# Patient Record
Sex: Male | Born: 1975
Health system: Southern US, Community
[De-identification: ages and names within clinical notes are randomized; demographics above are authoritative.]

## PROBLEM LIST (undated history)

## (undated) DIAGNOSIS — M1990 Unspecified osteoarthritis, unspecified site: Secondary | ICD-10-CM

## (undated) HISTORY — PX: WISDOM TOOTH EXTRACTION: SHX21

---

## 2016-11-19 DIAGNOSIS — Z0000 Encounter for general adult medical examination without abnormal findings: Secondary | ICD-10-CM | POA: Diagnosis not present

## 2016-11-23 DIAGNOSIS — E559 Vitamin D deficiency, unspecified: Secondary | ICD-10-CM | POA: Diagnosis not present

## 2016-11-23 DIAGNOSIS — E782 Mixed hyperlipidemia: Secondary | ICD-10-CM | POA: Diagnosis not present

## 2016-11-23 DIAGNOSIS — G4452 New daily persistent headache (NDPH): Secondary | ICD-10-CM | POA: Diagnosis not present

## 2016-11-23 DIAGNOSIS — G5603 Carpal tunnel syndrome, bilateral upper limbs: Secondary | ICD-10-CM | POA: Diagnosis not present

## 2016-11-23 MED FILL — ATORVASTATIN 40 MG TABLET: 40 | 30 days supply | Qty: 30 | Fill #0

## 2016-12-29 DIAGNOSIS — M7021 Olecranon bursitis, right elbow: Secondary | ICD-10-CM | POA: Diagnosis not present

## 2016-12-29 MED FILL — predniSONE 20 MG TABS: 20 | 7 days supply | Qty: 14 | Fill #0

## 2016-12-29 MED FILL — CEPHALEXIN 500 MG CAPSULE: 500 | 10 days supply | Qty: 30 | Fill #0

## 2017-02-01 MED FILL — valACYclovir HCL 1 GM TABS: 1 | 15 days supply | Qty: 30 | Fill #0

## 2017-03-24 DIAGNOSIS — E785 Hyperlipidemia, unspecified: Secondary | ICD-10-CM | POA: Diagnosis not present

## 2017-03-28 DIAGNOSIS — E782 Mixed hyperlipidemia: Secondary | ICD-10-CM | POA: Diagnosis not present

## 2017-03-28 DIAGNOSIS — R51 Headache: Secondary | ICD-10-CM | POA: Diagnosis not present

## 2017-03-28 DIAGNOSIS — Z23 Encounter for immunization: Secondary | ICD-10-CM | POA: Diagnosis not present

## 2017-05-05 DIAGNOSIS — G5603 Carpal tunnel syndrome, bilateral upper limbs: Secondary | ICD-10-CM | POA: Diagnosis not present

## 2017-05-05 MED FILL — DICLOFENAC SOD 75 MG TAB EC: 75 | 30 days supply | Qty: 60 | Fill #0

## 2017-05-23 ENCOUNTER — Encounter (INDEPENDENT_AMBULATORY_CARE_PROVIDER_SITE_OTHER): Payer: Self-pay | Admitting: Orthopaedic Surgery

## 2017-05-23 ENCOUNTER — Ambulatory Visit (INDEPENDENT_AMBULATORY_CARE_PROVIDER_SITE_OTHER): Payer: 59

## 2017-05-23 ENCOUNTER — Ambulatory Visit (INDEPENDENT_AMBULATORY_CARE_PROVIDER_SITE_OTHER): Payer: 59 | Admitting: Orthopaedic Surgery

## 2017-05-23 DIAGNOSIS — G5602 Carpal tunnel syndrome, left upper limb: Secondary | ICD-10-CM

## 2017-05-23 DIAGNOSIS — G5601 Carpal tunnel syndrome, right upper limb: Secondary | ICD-10-CM

## 2017-05-23 NOTE — Progress Notes (Signed)
   Office Visit Note   Patient: John Vaughn           Date of Birth: 02/27/1976           MRN: [id number] Visit Date: 05/23/2017              Requested by: No referring provider defined for this encounter. PCP: Lyndel SafeGentry, Daniel E., MD   Assessment & Plan: Visit Diagnoses:  1. Carpal tunnel syndrome, right upper limb   2. Carpal tunnel syndrome, left upper limb     Plan: Impression is bilateral carpal tunnel syndrome.  EMG is ordered with Dr. Alvester MorinNewton.  Follow-up after the EMGs.  Follow-Up Instructions: Return if symptoms worsen or fail to improve.   Orders:  Orders Placed This Encounter  Procedures  . XR Wrist Complete Right  . XR Wrist Complete Left   No orders of the defined types were placed in this encounter.     Procedures: No procedures performed   Clinical Data: No additional findings.   Subjective: Chief Complaint  Patient presents with  . Right Hand - Pain, Numbness  . Left Hand - Pain, Numbness    Patient is a 41 year old gentleman with hand pain and numbness at night with carpal tunnel splints which do help.  He has had 2 cortisone injections in his buttock which has helped.  That is worse with exertion and use of the hand.  He sleeps with carpal tunnel braces which do help..  Denies any injuries.    Review of Systems  Constitutional: Negative.   All other systems reviewed and are negative.    Objective: Vital Signs: There were no vitals taken for this visit.  Physical Exam  Constitutional: He is oriented to person, place, and time. He appears well-developed and well-nourished.  HENT:  Head: Normocephalic and atraumatic.  Eyes: Pupils are equal, round, and reactive to light.  Neck: Neck supple.  Pulmonary/Chest: Effort normal.  Abdominal: Soft.  Musculoskeletal: Normal range of motion.  Neurological: He is alert and oriented to person, place, and time.  Skin: Skin is warm.  Psychiatric: He has a normal mood and affect. His behavior is  normal. Judgment and thought content normal.  Nursing note and vitals reviewed.   Ortho Exam Bilateral hand exam shows no skin changes or lesions or rashes.  Negative carpal tunnel compression signs. Specialty Comments:  No specialty comments available.  Imaging: Xr Wrist Complete Left  Result Date: 05/23/2017 No acute findings.  Xr Wrist Complete Right  Result Date: 05/23/2017 No acute findings    PMFS History: There are no active problems to display for this patient.  History reviewed. No pertinent past medical history.  History reviewed. No pertinent family history.  History reviewed. No pertinent surgical history. Social History   Occupational History  . Not on file  Tobacco Use  . Smoking status: Not on file  Substance and Sexual Activity  . Alcohol use: Not on file  . Drug use: Not on file  . Sexual activity: Not on file

## 2017-05-24 ENCOUNTER — Other Ambulatory Visit (INDEPENDENT_AMBULATORY_CARE_PROVIDER_SITE_OTHER): Payer: Self-pay

## 2017-05-24 DIAGNOSIS — M79642 Pain in left hand: Principal | ICD-10-CM

## 2017-05-24 DIAGNOSIS — M79641 Pain in right hand: Secondary | ICD-10-CM

## 2017-06-10 ENCOUNTER — Ambulatory Visit (INDEPENDENT_AMBULATORY_CARE_PROVIDER_SITE_OTHER): Payer: 59 | Admitting: Physical Medicine and Rehabilitation

## 2017-06-10 ENCOUNTER — Encounter (INDEPENDENT_AMBULATORY_CARE_PROVIDER_SITE_OTHER): Payer: Self-pay | Admitting: Physical Medicine and Rehabilitation

## 2017-06-10 DIAGNOSIS — R202 Paresthesia of skin: Secondary | ICD-10-CM | POA: Diagnosis not present

## 2017-06-10 NOTE — Progress Notes (Deleted)
Bilateral hand and wrist pain. States right side is worse. Pain and numbness.  1 month ago increased constant pain, has had cortisone inj which has helped. Numb from thumb to 4th finger on right side. Mixing motion, using weed-eater cause "twinge" and loses grib. Dropping items more. Has to drive with one had at a time, hands will go numb. Wears braces at night, have been helpful. Pain and numbness is getting worse at night with braces.

## 2017-06-15 ENCOUNTER — Encounter (INDEPENDENT_AMBULATORY_CARE_PROVIDER_SITE_OTHER): Payer: Self-pay | Admitting: Physical Medicine and Rehabilitation

## 2017-06-15 NOTE — Progress Notes (Signed)
John Vaughn - 41 y.o. male MRN [id number]  Date of birth: 02/25/1976  Office Visit Note: Visit Date: 06/10/2017 PCP: Lyndel SafeGentry, Daniel E., MD Referred by: Lyndel SafeGentry, Daniel E., MD  Subjective: Chief Complaint  Patient presents with  . Right Hand - Pain, Numbness, Weakness  . Left Hand - Pain, Weakness, Numbness   HPI: John Vaughn is a 41 year old right-hand-dominant who has been having pain numbness and tingling right more than left hand fairly classic median nerve distribution.  He states that he is really numb from his thumb to his fourth digit on the right he initially saw his family practitioner in [place] cortisone injection intramuscularly which seemed to help some.  He reports worsening symptoms with using the weed eater and other jobs at night.  Feels like he has been dropping items a lot.  Reports worsening symptoms with driving.  Braces at night have been helpful.  Dr. Roda ShuttersXu saw him recently and ordered electrodiagnostic studies.  He has had no prior electrodiagnostic studies.  No frank radicular symptoms.  No specific trauma.    ROS Otherwise per HPI.  Assessment & Plan: Visit Diagnoses:  1. Paresthesia of skin     Plan: No additional findings.  Impression: The above electrodiagnostic study is ABNORMAL and reveals evidence of:  1.  A moderate right median nerve entrapment at the wrist (carpal tunnel syndrome) affecting sensory and motor components.   2.  A mild left median nerve entrapment at the wrist (carpal tunnel syndrome) affecting sensory components.   There is no significant electrodiagnostic evidence of any other focal nerve entrapment, brachial plexopathy or cervical radiculopathy.     Recommendations: 1.  Follow-up with referring physician. 2.  Continue current management of symptoms. 3.  Continue use of resting splint at night-time and as needed during the day. May consider surgical decompression on the right.   Meds & Orders: No orders of the defined  types were placed in this encounter.   Orders Placed This Encounter  Procedures  . NCV with EMG (electromyography)    Follow-up: Return for Dr. Roda ShuttersXu.   Procedures: No procedures performed  EMG & NCV Findings: Evaluation of the right median motor nerve showed prolonged distal onset latency (4.5 ms).  The left median (across palm) sensory and the right median (across palm) sensory nerves showed prolonged distal peak latency (Wrist, L3.9, R4.7 ms).  All remaining nerves (as indicated in the following tables) were within normal limits.  Left vs. Right side comparison data for the median motor nerve indicates abnormal L-R latency difference (0.8 ms).  The ulnar motor nerve indicates abnormal L-R amplitude difference (36.8 %).  All remaining left vs. right side differences were within normal limits.    All examined muscles (as indicated in the following table) showed no evidence of electrical instability.    Impression: The above electrodiagnostic study is ABNORMAL and reveals evidence of:  1.  A moderate right median nerve entrapment at the wrist (carpal tunnel syndrome) affecting sensory and motor components.   2.  A mild left median nerve entrapment at the wrist (carpal tunnel syndrome) affecting sensory components.   There is no significant electrodiagnostic evidence of any other focal nerve entrapment, brachial plexopathy or cervical radiculopathy.     Recommendations: 1.  Follow-up with referring physician. 2.  Continue current management of symptoms. 3.  Continue use of resting splint at night-time and as needed during the day. May consider surgical decompression on the right.    Nerve Conduction Studies  Anti Sensory Summary Table   Stim Site NR Peak (ms) Norm Peak (ms) P-T Amp (V) Norm P-T Amp Site1 Site2 Delta-P (ms) Dist (cm) Vel (m/s) Norm Vel (m/s)  Left Median Acr Palm Anti Sensory (2nd Digit)  31.4C  Wrist    *3.9 <3.6 18.1 >10 Wrist Palm 2.1 0.0    Palm    1.8 <2.0 22.3          Right Median Acr Palm Anti Sensory (2nd Digit)  32.2C  Wrist    *4.7 <3.6 19.0 >10 Wrist Palm 2.7 0.0    Palm    2.0 <2.0 22.6         Left Radial Anti Sensory (Base 1st Digit)  31.8C  Wrist    2.4 <3.1 11.1  Wrist Base 1st Digit 2.4 0.0    Right Radial Anti Sensory (Base 1st Digit)  31.4C  Wrist    2.3 <3.1 15.9  Wrist Base 1st Digit 2.3 0.0    Left Ulnar Anti Sensory (5th Digit)  32.1C  Wrist    3.0 <3.7 21.9 >15.0 Wrist 5th Digit 3.0 14.0 47 >38  Right Ulnar Anti Sensory (5th Digit)  31.7C  Wrist    3.1 <3.7 24.2 >15.0 Wrist 5th Digit 3.1 14.0 45 >38   Motor Summary Table   Stim Site NR Onset (ms) Norm Onset (ms) O-P Amp (mV) Norm O-P Amp Site1 Site2 Delta-0 (ms) Dist (cm) Vel (m/s) Norm Vel (m/s)  Left Median Motor (Abd Poll Brev)  32C  Wrist    3.7 <4.2 7.5 >5 Elbow Wrist 4.0 23.2 58 >50  Elbow    7.7  6.6         Right Median Motor (Abd Poll Brev)  31C  Wrist    *4.5 <4.2 6.4 >5 Elbow Wrist 4.6 23.5 51 >50  Elbow    9.1  4.5         Left Ulnar Motor (Abd Dig Min)  32.2C  Wrist    2.7 <4.2 12.5 >3 B Elbow Wrist 3.7 22.0 59 >53  B Elbow    6.4  12.1  A Elbow B Elbow 1.3 10.0 77 >53  A Elbow    7.7  12.6         Right Ulnar Motor (Abd Dig Min)  30.7C  Wrist    3.0 <4.2 7.9 >3 B Elbow Wrist 3.4 22.5 66 >53  B Elbow    6.4  12.7  A Elbow B Elbow 1.3 10.5 81 >53  A Elbow    7.7  13.1          EMG   Side Muscle Nerve Root Ins Act Fibs Psw Amp Dur Poly Recrt Int Dennie Bible Comment  Right Abd Poll Brev Median C8-T1 Nml Nml Nml Nml Nml 0 Nml Nml   Right 1stDorInt Ulnar C8-T1 Nml Nml Nml Nml Nml 0 Nml Nml   Right PronatorTeres Median C6-7 Nml Nml Nml Nml Nml 0 Nml Nml   Right Biceps Musculocut C5-6 Nml Nml Nml Nml Nml 0 Nml Nml   Right Deltoid Axillary C5-6 Nml Nml Nml Nml Nml 0 Nml Nml     Nerve Conduction Studies Anti Sensory Left/Right Comparison   Stim Site L Lat (ms) R Lat (ms) L-R Lat (ms) L Amp (V) R Amp (V) L-R Amp (%) Site1 Site2 L Vel (m/s) R Vel (m/s) L-R  Vel (m/s)  Median Acr Palm Anti Sensory (2nd Digit)  31.4C  Wrist *3.9 *4.7 0.8 18.1 19.0 4.7  Wrist Palm     Palm 1.8 2.0 0.2 22.3 22.6 1.3       Radial Anti Sensory (Base 1st Digit)  31.8C  Wrist 2.4 2.3 0.1 11.1 15.9 30.2 Wrist Base 1st Digit     Ulnar Anti Sensory (5th Digit)  32.1C  Wrist 3.0 3.1 0.1 21.9 24.2 9.5 Wrist 5th Digit 47 45 2   Motor Left/Right Comparison   Stim Site L Lat (ms) R Lat (ms) L-R Lat (ms) L Amp (mV) R Amp (mV) L-R Amp (%) Site1 Site2 L Vel (m/s) R Vel (m/s) L-R Vel (m/s)  Median Motor (Abd Poll Brev)  32C  Wrist 3.7 *4.5 *0.8 7.5 6.4 14.7 Elbow Wrist 58 51 7  Elbow 7.7 9.1 1.4 6.6 4.5 31.8       Ulnar Motor (Abd Dig Min)  32.2C  Wrist 2.7 3.0 0.3 12.5 7.9 *36.8 B Elbow Wrist 59 66 7  B Elbow 6.4 6.4 0.0 12.1 12.7 4.7 A Elbow B Elbow 77 81 4  A Elbow 7.7 7.7 0.0 12.6 13.1 3.8          Waveforms:                     Clinical History: No specialty comments available.  He has no tobacco history on file. No results for input(s): HGBA1C, LABURIC in the last 8760 hours.  Objective:  VS:  HT:    WT:   BMI:     BP:   HR: bpm  TEMP: ( )  RESP:  Physical Exam  Musculoskeletal:  Inspection reveals no atrophy of the bilateral APB or FDI or hand intrinsics. There is no swelling, color changes, allodynia or dystrophic changes. There is 5 out of 5 strength in the bilateral wrist extension, finger abduction and long finger flexion. There is intact sensation to light touch in all dermatomal and peripheral nerve distributions. There is a positive Phalen's test on the right. There is a negative Hoffmann's test bilaterally.    Ortho Exam Imaging: No results found.  Past Medical/Family/Surgical/Social History: Medications & Allergies reviewed per EMR There are no active problems to display for this patient.  History reviewed. No pertinent past medical history. History reviewed. No pertinent family history. History reviewed. No pertinent surgical  history. Social History   Occupational History  . Not on file  Tobacco Use  . Smoking status: Not on file  Substance and Sexual Activity  . Alcohol use: Not on file  . Drug use: Not on file  . Sexual activity: Not on file

## 2017-06-15 NOTE — Procedures (Signed)
EMG & NCV Findings: Evaluation of the right median motor nerve showed prolonged distal onset latency (4.5 ms).  The left median (across palm) sensory and the right median (across palm) sensory nerves showed prolonged distal peak latency (Wrist, L3.9, R4.7 ms).  All remaining nerves (as indicated in the following tables) were within normal limits.  Left vs. Right side comparison data for the median motor nerve indicates abnormal L-R latency difference (0.8 ms).  The ulnar motor nerve indicates abnormal L-R amplitude difference (36.8 %).  All remaining left vs. right side differences were within normal limits.    All examined muscles (as indicated in the following table) showed no evidence of electrical instability.    Impression: The above electrodiagnostic study is ABNORMAL and reveals evidence of:  1.  A moderate right median nerve entrapment at the wrist (carpal tunnel syndrome) affecting sensory and motor components.   2.  A mild left median nerve entrapment at the wrist (carpal tunnel syndrome) affecting sensory components.   There is no significant electrodiagnostic evidence of any other focal nerve entrapment, brachial plexopathy or cervical radiculopathy.     Recommendations: 1.  Follow-up with referring physician. 2.  Continue current management of symptoms. 3.  Continue use of resting splint at night-time and as needed during the day. May consider surgical decompression on the right.    Nerve Conduction Studies Anti Sensory Summary Table   Stim Site NR Peak (ms) Norm Peak (ms) P-T Amp (V) Norm P-T Amp Site1 Site2 Delta-P (ms) Dist (cm) Vel (m/s) Norm Vel (m/s)  Left Median Acr Palm Anti Sensory (2nd Digit)  31.4C  Wrist    *3.9 <3.6 18.1 >10 Wrist Palm 2.1 0.0    Palm    1.8 <2.0 22.3         Right Median Acr Palm Anti Sensory (2nd Digit)  32.2C  Wrist    *4.7 <3.6 19.0 >10 Wrist Palm 2.7 0.0    Palm    2.0 <2.0 22.6         Left Radial Anti Sensory (Base 1st Digit)   31.8C  Wrist    2.4 <3.1 11.1  Wrist Base 1st Digit 2.4 0.0    Right Radial Anti Sensory (Base 1st Digit)  31.4C  Wrist    2.3 <3.1 15.9  Wrist Base 1st Digit 2.3 0.0    Left Ulnar Anti Sensory (5th Digit)  32.1C  Wrist    3.0 <3.7 21.9 >15.0 Wrist 5th Digit 3.0 14.0 47 >38  Right Ulnar Anti Sensory (5th Digit)  31.7C  Wrist    3.1 <3.7 24.2 >15.0 Wrist 5th Digit 3.1 14.0 45 >38   Motor Summary Table   Stim Site NR Onset (ms) Norm Onset (ms) O-P Amp (mV) Norm O-P Amp Site1 Site2 Delta-0 (ms) Dist (cm) Vel (m/s) Norm Vel (m/s)  Left Median Motor (Abd Poll Brev)  32C  Wrist    3.7 <4.2 7.5 >5 Elbow Wrist 4.0 23.2 58 >50  Elbow    7.7  6.6         Right Median Motor (Abd Poll Brev)  31C  Wrist    *4.5 <4.2 6.4 >5 Elbow Wrist 4.6 23.5 51 >50  Elbow    9.1  4.5         Left Ulnar Motor (Abd Dig Min)  32.2C  Wrist    2.7 <4.2 12.5 >3 B Elbow Wrist 3.7 22.0 59 >53  B Elbow    6.4  12.1  A Elbow  B Elbow 1.3 10.0 77 >53  A Elbow    7.7  12.6         Right Ulnar Motor (Abd Dig Min)  30.7C  Wrist    3.0 <4.2 7.9 >3 B Elbow Wrist 3.4 22.5 66 >53  B Elbow    6.4  12.7  A Elbow B Elbow 1.3 10.5 81 >53  A Elbow    7.7  13.1          EMG   Side Muscle Nerve Root Ins Act Fibs Psw Amp Dur Poly Recrt Int Dennie BiblePat Comment  Right Abd Poll Brev Median C8-T1 Nml Nml Nml Nml Nml 0 Nml Nml   Right 1stDorInt Ulnar C8-T1 Nml Nml Nml Nml Nml 0 Nml Nml   Right PronatorTeres Median C6-7 Nml Nml Nml Nml Nml 0 Nml Nml   Right Biceps Musculocut C5-6 Nml Nml Nml Nml Nml 0 Nml Nml   Right Deltoid Axillary C5-6 Nml Nml Nml Nml Nml 0 Nml Nml     Nerve Conduction Studies Anti Sensory Left/Right Comparison   Stim Site L Lat (ms) R Lat (ms) L-R Lat (ms) L Amp (V) R Amp (V) L-R Amp (%) Site1 Site2 L Vel (m/s) R Vel (m/s) L-R Vel (m/s)  Median Acr Palm Anti Sensory (2nd Digit)  31.4C  Wrist *3.9 *4.7 0.8 18.1 19.0 4.7 Wrist Palm     Palm 1.8 2.0 0.2 22.3 22.6 1.3       Radial Anti Sensory (Base 1st Digit)   31.8C  Wrist 2.4 2.3 0.1 11.1 15.9 30.2 Wrist Base 1st Digit     Ulnar Anti Sensory (5th Digit)  32.1C  Wrist 3.0 3.1 0.1 21.9 24.2 9.5 Wrist 5th Digit 47 45 2   Motor Left/Right Comparison   Stim Site L Lat (ms) R Lat (ms) L-R Lat (ms) L Amp (mV) R Amp (mV) L-R Amp (%) Site1 Site2 L Vel (m/s) R Vel (m/s) L-R Vel (m/s)  Median Motor (Abd Poll Brev)  32C  Wrist 3.7 *4.5 *0.8 7.5 6.4 14.7 Elbow Wrist 58 51 7  Elbow 7.7 9.1 1.4 6.6 4.5 31.8       Ulnar Motor (Abd Dig Min)  32.2C  Wrist 2.7 3.0 0.3 12.5 7.9 *36.8 B Elbow Wrist 59 66 7  B Elbow 6.4 6.4 0.0 12.1 12.7 4.7 A Elbow B Elbow 77 81 4  A Elbow 7.7 7.7 0.0 12.6 13.1 3.8          Waveforms:

## 2017-06-16 ENCOUNTER — Ambulatory Visit (INDEPENDENT_AMBULATORY_CARE_PROVIDER_SITE_OTHER): Payer: 59 | Admitting: Orthopaedic Surgery

## 2017-06-16 DIAGNOSIS — G5601 Carpal tunnel syndrome, right upper limb: Secondary | ICD-10-CM | POA: Diagnosis not present

## 2017-06-16 MED ORDER — LIDOCAINE HCL 1 % IJ SOLN
1.0000 mL | INTRAMUSCULAR | Status: AC | PRN
  Administered 2017-06-16: 1 mL

## 2017-06-16 MED ORDER — METHYLPREDNISOLONE ACETATE 40 MG/ML IJ SUSP
40.0000 mg | INTRAMUSCULAR | Status: AC | PRN
Start: 2017-06-16 — End: 2017-06-16
  Administered 2017-06-16: 40 mg

## 2017-06-16 MED ORDER — BUPIVACAINE HCL 0.5 % IJ SOLN
1.0000 mL | INTRAMUSCULAR | Status: AC | PRN
  Administered 2017-06-16: 1 mL

## 2017-06-16 NOTE — Progress Notes (Signed)
   Office Visit Note   Patient: John Vaughn           Date of Birth: 09/14/1975           MRN: [id number] Visit Date: 06/16/2017              Requested by: Lyndel SafeGentry, Daniel E., MD [address] [place], [place] [id number] PCP: Lyndel SafeGentry, Daniel E., MD   Assessment & Plan: Visit Diagnoses:  1. Carpal tunnel syndrome, right upper limb     Plan: Impression is moderate right carpal tunnel syndrome and mild left carpal tunnel syndrome.  We had a long discussion regarding the treatment options including injection versus splinting versus surgical release.  We talked about the details of surgery and the associated risk benefits alternatives.  Patient would like to try to injection in his right wrist first.  This was performed today.  Follow-up with me as needed. Total face to face encounter time was greater than 25 minutes and over half of this time was spent in counseling and/or coordination of care.  Follow-Up Instructions: Return if symptoms worsen or fail to improve.   Orders:  No orders of the defined types were placed in this encounter.  No orders of the defined types were placed in this encounter.     Procedures: Hand/UE Inj: R carpal tunnel for carpal tunnel syndrome on 06/16/2017 9:17 AM Indications: pain Details: 25 G needle Medications: 1 mL lidocaine 1 %; 1 mL bupivacaine 0.5 %; 40 mg methylPREDNISolone acetate 40 MG/ML Outcome: tolerated well, no immediate complications Patient was prepped and draped in the usual sterile fashion.       Clinical Data: No additional findings.   Subjective: No chief complaint on file.   Patient follows up for his carpal tunnel disease EMGs.  This continues to bother him more so on the right.    Review of Systems  Constitutional: Negative.   All other systems reviewed and are negative.    Objective: Vital Signs: There were no vitals taken for this visit.  Physical Exam  Constitutional: He is oriented to person,  place, and time. He appears well-developed and well-nourished.  Pulmonary/Chest: Effort normal.  Abdominal: Soft.  Neurological: He is alert and oriented to person, place, and time.  Skin: Skin is warm.  Psychiatric: He has a normal mood and affect. His behavior is normal. Judgment and thought content normal.  Nursing note and vitals reviewed.   Ortho Exam Bilateral hand exam is stable. Specialty Comments:  No specialty comments available.  Imaging: No results found.   PMFS History: There are no active problems to display for this patient.  No past medical history on file.  No family history on file.  No past surgical history on file. Social History   Occupational History  . Not on file  Tobacco Use  . Smoking status: Not on file  Substance and Sexual Activity  . Alcohol use: Not on file  . Drug use: Not on file  . Sexual activity: Not on file

## 2017-08-26 MED FILL — ATORVASTATIN 40 MG TABLET: 40 | 30 days supply | Qty: 30 | Fill #1

## 2017-08-26 MED FILL — valACYclovir HCL 1 GM TABS: 1 | 15 days supply | Qty: 30 | Fill #0

## 2017-09-15 ENCOUNTER — Telehealth (INDEPENDENT_AMBULATORY_CARE_PROVIDER_SITE_OTHER): Payer: Self-pay | Admitting: Physician Assistant

## 2017-09-15 NOTE — Telephone Encounter (Signed)
FYI Patient called, said his hand is hurting really bad and going numb. He is requesting a cortisone injection in his right wrist tomorrow with Lillia AbedLindsay, he couldn't come in until later so his appt is made for 2:45.

## 2017-09-15 NOTE — Telephone Encounter (Signed)
FYI

## 2017-09-15 NOTE — Telephone Encounter (Signed)
ok 

## 2017-09-16 ENCOUNTER — Encounter (INDEPENDENT_AMBULATORY_CARE_PROVIDER_SITE_OTHER): Payer: Self-pay | Admitting: Physician Assistant

## 2017-09-16 ENCOUNTER — Ambulatory Visit (INDEPENDENT_AMBULATORY_CARE_PROVIDER_SITE_OTHER): Payer: 59 | Admitting: Physician Assistant

## 2017-09-16 DIAGNOSIS — G5601 Carpal tunnel syndrome, right upper limb: Secondary | ICD-10-CM | POA: Insufficient documentation

## 2017-09-16 MED ORDER — BUPIVACAINE HCL 0.25 % IJ SOLN
0.3300 mL | INTRAMUSCULAR | Status: AC | PRN
  Administered 2017-09-16: .33 mL

## 2017-09-16 MED ORDER — LIDOCAINE HCL 1 % IJ SOLN
0.3000 mL | INTRAMUSCULAR | Status: AC | PRN
  Administered 2017-09-16: .3 mL

## 2017-09-16 MED ORDER — METHYLPREDNISOLONE ACETATE 40 MG/ML IJ SUSP
13.3300 mg | INTRAMUSCULAR | Status: AC | PRN
  Administered 2017-09-16: 13.33 mg

## 2017-09-16 NOTE — Progress Notes (Signed)
   Office Visit Note   Patient: John Vaughn           Date of Birth: 10/02/1975           MRN: [id number] Visit Date: 09/16/2017              Requested by: John Vaughn, John E., John Vaughn No address on file PCP: John Vaughn, John E., John Vaughn   Assessment & Plan: Visit Diagnoses:  1. Carpal tunnel syndrome, right upper limb     Plan: Impression is right carpal tunnel syndrome.  Today, we reinjected his carpal tunnel with cortisone.  I told him this will likely be less effective than the previous injection.  We will also go ahead and schedule a right carpal tunnel release.  Risk benefits possible locations reviewed.  Rehab car transfers.  All questions were answered.  Paperwork complete.  He will call with concerns or questions in the meantime.  Follow-Up Instructions: Return if symptoms worsen or fail to improve.   Orders:  Orders Placed This Encounter  Procedures  . Hand/UE Inj: R carpal tunnel   No orders of the defined types were placed in this encounter.     Procedures: Hand/UE Inj: R carpal tunnel for carpal tunnel syndrome on 09/16/2017 3:05 PM Indications: pain Details: 25 G needle, volar approach Medications: 0.3 mL lidocaine 1 %; 0.33 mL bupivacaine 0.25 %; 13.33 mg methylPREDNISolone acetate 40 MG/ML      Clinical Data: No additional findings.   Subjective: Chief Complaint  Patient presents with  . Right Wrist - Pain    HPI Wolfe is a pleasant [age] year old gentleman who presents our clinic today with recurrent right hand pain.  History of carpal tunnel syndrome which is been injected with cortisone most recently in December 2018.  Moderate relief, but this only lasted for 2 months.  His pain has returned.  He is starting to get some weakness to the right hand.  He does note numbness tingling and burning to his thumb, index and long fingers.  He is no longer wearing his wrist splint at night as this no longer helps.  He would like to repeat his cortisone injection today as  well as schedule surgical intervention for a carpal tunnel release.  Of note his previous nerve conduction study showed moderate median nerve compression on the right and mild on the left.  Review of Systems as detailed in HPI.  All others are negative.   Objective: Vital Signs: There were no vitals taken for this visit.  Physical Exam well-developed well-nourished gentleman in no acute distress.  Alert and oriented x3.  Ortho Exam examination of his right hand reveals no thenar atrophy.  Positive Phalen and Tinel.  Specialty Comments:  No specialty comments available.  Imaging: No new imaging today.   PMFS History: Patient Active Problem List   Diagnosis Date Noted  . Carpal tunnel syndrome, right upper limb 09/16/2017   History reviewed. No pertinent past medical history.  History reviewed. No pertinent family history.  History reviewed. No pertinent surgical history. Social History   Occupational History  . Not on file  Tobacco Use  . Smoking status: Not on file  Substance and Sexual Activity  . Alcohol use: Not on file  . Drug use: Not on file  . Sexual activity: Not on file

## 2017-10-14 DIAGNOSIS — G5603 Carpal tunnel syndrome, bilateral upper limbs: Secondary | ICD-10-CM | POA: Diagnosis not present

## 2017-11-10 DIAGNOSIS — E782 Mixed hyperlipidemia: Secondary | ICD-10-CM | POA: Diagnosis not present

## 2017-11-14 DIAGNOSIS — Z0000 Encounter for general adult medical examination without abnormal findings: Secondary | ICD-10-CM | POA: Diagnosis not present

## 2017-12-09 ENCOUNTER — Encounter (INDEPENDENT_AMBULATORY_CARE_PROVIDER_SITE_OTHER): Payer: Self-pay | Admitting: Orthopaedic Surgery

## 2017-12-09 ENCOUNTER — Ambulatory Visit (INDEPENDENT_AMBULATORY_CARE_PROVIDER_SITE_OTHER): Payer: 59 | Admitting: Orthopaedic Surgery

## 2017-12-09 DIAGNOSIS — G5602 Carpal tunnel syndrome, left upper limb: Secondary | ICD-10-CM

## 2017-12-09 DIAGNOSIS — G5601 Carpal tunnel syndrome, right upper limb: Secondary | ICD-10-CM | POA: Diagnosis not present

## 2017-12-09 MED ORDER — METHYLPREDNISOLONE ACETATE 40 MG/ML IJ SUSP
40.0000 mg | INTRAMUSCULAR | Status: AC | PRN
  Administered 2017-12-09: 40 mg

## 2017-12-09 MED ORDER — BUPIVACAINE HCL 0.5 % IJ SOLN
1.0000 mL | INTRAMUSCULAR | Status: AC | PRN
  Administered 2017-12-09: 1 mL

## 2017-12-09 MED ORDER — LIDOCAINE HCL 1 % IJ SOLN
1.0000 mL | INTRAMUSCULAR | Status: AC | PRN
  Administered 2017-12-09: 1 mL

## 2017-12-09 NOTE — Progress Notes (Signed)
   Office Visit Note   Patient: John CushingRandol B Pappalardo           Date of Birth: 11/14/1975           MRN: [id number] Visit Date: 12/09/2017              Requested by: Lyndel SafeGentry, Daniel E., MD [address] [place], [place] [id number] PCP: Lyndel SafeGentry, Daniel E., MD   Assessment & Plan: Visit Diagnoses:  1. Carpal tunnel syndrome, right upper limb   2. Carpal tunnel syndrome, left upper limb     Plan: Impression is moderate right carpal tunnel syndrome and mild left carpal tunnel syndrome.  At this point patient would like to schedule surgery for the right carpal tunnel release.  He is aware the risks and benefits.  He understands postoperative recovery and rehab.  Bilateral carpal tunnel injections performed today.  Follow-Up Instructions: Return if symptoms worsen or fail to improve.   Orders:  No orders of the defined types were placed in this encounter.  No orders of the defined types were placed in this encounter.     Procedures: Hand/UE Inj: bilateral carpal tunnel for carpal tunnel syndrome on 12/09/2017 11:02 AM Indications: pain Details: 25 G needle Medications (Right): 1 mL lidocaine 1 %; 1 mL bupivacaine 0.5 %; 40 mg methylPREDNISolone acetate 40 MG/ML Medications (Left): 1 mL lidocaine 1 %; 1 mL bupivacaine 0.5 %; 40 mg methylPREDNISolone acetate 40 MG/ML Outcome: tolerated well, no immediate complications Patient was prepped and draped in the usual sterile fashion.       Clinical Data: No additional findings.   Subjective: Chief Complaint  Patient presents with  . Right Wrist - Pain  . Left Wrist - Pain    Patient comes in for follow-up of bilateral carpal tunnel syndrome.  He is requesting injections in both.  He would also like to schedule surgery for his right hand.  He is now having skin pain and numbness in his right hand that is worse with activity and worse at night.  This is waking him up.  He is in agony from this.   Review of  Systems   Objective: Vital Signs: There were no vitals taken for this visit.  Physical Exam  Ortho Exam Bilateral hand exam stable. Specialty Comments:  No specialty comments available.  Imaging: No results found.   PMFS History: Patient Active Problem List   Diagnosis Date Noted  . Carpal tunnel syndrome, right upper limb 09/16/2017   History reviewed. No pertinent past medical history.  History reviewed. No pertinent family history.  History reviewed. No pertinent surgical history. Social History   Occupational History  . Not on file  Tobacco Use  . Smoking status: Never Smoker  . Smokeless tobacco: Never Used  Substance and Sexual Activity  . Alcohol use: Not on file  . Drug use: Not on file  . Sexual activity: Not on file

## 2018-02-14 MED FILL — ATORVASTATIN 40 MG TABLET: 40 | 30 days supply | Qty: 30 | Fill #0

## 2018-02-16 ENCOUNTER — Encounter (HOSPITAL_BASED_OUTPATIENT_CLINIC_OR_DEPARTMENT_OTHER): Payer: Self-pay | Admitting: *Deleted

## 2018-02-16 ENCOUNTER — Other Ambulatory Visit: Payer: Self-pay

## 2018-02-23 NOTE — Anesthesia Preprocedure Evaluation (Addendum)
Anesthesia Evaluation  Patient identified by MRN, date of birth, ID band Patient awake    Reviewed: Allergy & Precautions, NPO status , Patient's Chart, lab work & pertinent test results  Airway Mallampati: I  TM Distance: >3 FB Neck ROM: Full    Dental no notable dental hx. (+) Teeth Intact, Dental Advisory Given   Pulmonary neg pulmonary ROS,    Pulmonary exam normal breath sounds clear to auscultation       Cardiovascular Exercise Tolerance: Good negative cardio ROS Normal cardiovascular exam Rhythm:Regular Rate:Normal     Neuro/Psych negative neurological ROS  negative psych ROS   GI/Hepatic negative GI ROS,   Endo/Other    Renal/GU      Musculoskeletal   Abdominal   Peds  Hematology   Anesthesia Other Findings   Reproductive/Obstetrics                             Anesthesia Physical Anesthesia Plan  ASA: I  Anesthesia Plan: General   Post-op Pain Management:    Induction: Intravenous  PONV Risk Score and Plan: Treatment may vary due to age or medical condition, Ondansetron and Dexamethasone  Airway Management Planned: Nasal Cannula and LMA  Additional Equipment:   Intra-op Plan:   Post-operative Plan:   Informed Consent: I have reviewed the patients History and Physical, chart, labs and discussed the procedure including the risks, benefits and alternatives for the proposed anesthesia with the patient or authorized representative who has indicated his/her understanding and acceptance.   Dental advisory given  Plan Discussed with: CRNA  Anesthesia Plan Comments:        Anesthesia Quick Evaluation

## 2018-02-24 ENCOUNTER — Encounter (HOSPITAL_BASED_OUTPATIENT_CLINIC_OR_DEPARTMENT_OTHER): Payer: Self-pay | Admitting: Anesthesiology

## 2018-02-24 ENCOUNTER — Other Ambulatory Visit: Payer: Self-pay

## 2018-02-24 ENCOUNTER — Ambulatory Visit (HOSPITAL_BASED_OUTPATIENT_CLINIC_OR_DEPARTMENT_OTHER): Payer: 59 | Admitting: Anesthesiology

## 2018-02-24 ENCOUNTER — Encounter (HOSPITAL_BASED_OUTPATIENT_CLINIC_OR_DEPARTMENT_OTHER)
Admission: RE | Disposition: A | Discharge status: Home / Self Care | Payer: Self-pay | Source: Ambulatory Visit | Attending: Orthopaedic Surgery

## 2018-02-24 ENCOUNTER — Ambulatory Visit (HOSPITAL_BASED_OUTPATIENT_CLINIC_OR_DEPARTMENT_OTHER)
Admission: RE | Admit: 2018-02-24 | Discharge: 2018-02-24 | Disposition: A | Discharge status: Home / Self Care | Payer: 59 | Source: Ambulatory Visit | Attending: Orthopaedic Surgery | Admitting: Orthopaedic Surgery

## 2018-02-24 DIAGNOSIS — Z79899 Other long term (current) drug therapy: Secondary | ICD-10-CM | POA: Insufficient documentation

## 2018-02-24 DIAGNOSIS — G5601 Carpal tunnel syndrome, right upper limb: Secondary | ICD-10-CM | POA: Insufficient documentation

## 2018-02-24 HISTORY — PX: CARPAL TUNNEL RELEASE: SHX101

## 2018-02-24 SURGERY — CARPAL TUNNEL RELEASE
Anesthesia: General | Site: Hand | Laterality: Right

## 2018-02-24 MED ORDER — SCOPOLAMINE 1 MG/3DAYS TD PT72: 1.0000 | MEDICATED_PATCH | Freq: Once | TRANSDERMAL | Status: DC | PRN

## 2018-02-24 MED ORDER — LIDOCAINE HCL (CARDIAC) PF 100 MG/5ML IV SOSY
PREFILLED_SYRINGE | INTRAVENOUS | Status: DC | PRN
  Administered 2018-02-24: 100 mg via INTRAVENOUS

## 2018-02-24 MED ORDER — CHLORHEXIDINE GLUCONATE 4 % EX LIQD: 60.0000 mL | Freq: Once | CUTANEOUS | Status: DC

## 2018-02-24 MED ORDER — ONDANSETRON HCL 4 MG/2ML IJ SOLN
INTRAMUSCULAR | Status: DC | PRN
  Administered 2018-02-24: 4 mg via INTRAVENOUS

## 2018-02-24 MED ORDER — LACTATED RINGERS IV SOLN
INTRAVENOUS | Status: DC
  Administered 2018-02-24: 11:00:00 via INTRAVENOUS

## 2018-02-24 MED ORDER — ACETAMINOPHEN 10 MG/ML IV SOLN: 1000.0000 mg | Freq: Once | INTRAVENOUS | Status: DC | PRN

## 2018-02-24 MED ORDER — HYDROMORPHONE HCL 1 MG/ML IJ SOLN: 0.2500 mg | INTRAMUSCULAR | Status: DC | PRN

## 2018-02-24 MED ORDER — ONDANSETRON HCL 4 MG PO TABS: 4.0000 mg | ORAL_TABLET | Freq: Three times a day (TID) | ORAL | 0 refills | Status: DC | PRN

## 2018-02-24 MED ORDER — KETOROLAC TROMETHAMINE 30 MG/ML IJ SOLN
INTRAMUSCULAR | Status: DC | PRN
  Administered 2018-02-24: 30 mg via INTRAVENOUS

## 2018-02-24 MED ORDER — MIDAZOLAM HCL 2 MG/2ML IJ SOLN
INTRAMUSCULAR | Status: AC
  Filled 2018-02-24: qty 2

## 2018-02-24 MED ORDER — LIDOCAINE 2% (20 MG/ML) 5 ML SYRINGE
INTRAMUSCULAR | Status: AC
  Filled 2018-02-24: qty 5

## 2018-02-24 MED ORDER — MIDAZOLAM HCL 2 MG/2ML IJ SOLN
1.0000 mg | INTRAMUSCULAR | Status: DC | PRN
  Administered 2018-02-24: 2 mg via INTRAVENOUS

## 2018-02-24 MED ORDER — ACETAMINOPHEN 500 MG PO TABS
1000.0000 mg | ORAL_TABLET | Freq: Once | ORAL | Status: AC
  Administered 2018-02-24: 1000 mg via ORAL

## 2018-02-24 MED ORDER — PROMETHAZINE HCL 25 MG/ML IJ SOLN: 6.2500 mg | INTRAMUSCULAR | Status: DC | PRN

## 2018-02-24 MED ORDER — CEFAZOLIN SODIUM-DEXTROSE 2-4 GM/100ML-% IV SOLN
2.0000 g | INTRAVENOUS | Status: AC
  Administered 2018-02-24: 2 g via INTRAVENOUS

## 2018-02-24 MED ORDER — FENTANYL CITRATE (PF) 100 MCG/2ML IJ SOLN
50.0000 ug | INTRAMUSCULAR | Status: DC | PRN
  Administered 2018-02-24 (×2): 50 ug via INTRAVENOUS

## 2018-02-24 MED ORDER — KETOROLAC TROMETHAMINE 30 MG/ML IJ SOLN
INTRAMUSCULAR | Status: AC
  Filled 2018-02-24: qty 1

## 2018-02-24 MED ORDER — FENTANYL CITRATE (PF) 100 MCG/2ML IJ SOLN
INTRAMUSCULAR | Status: AC
  Filled 2018-02-24: qty 2

## 2018-02-24 MED ORDER — SENNOSIDES-DOCUSATE SODIUM 8.6-50 MG PO TABS: 1.0000 | ORAL_TABLET | Freq: Every evening | ORAL | 1 refills | Status: DC | PRN

## 2018-02-24 MED ORDER — DEXAMETHASONE SODIUM PHOSPHATE 4 MG/ML IJ SOLN
INTRAMUSCULAR | Status: DC | PRN
  Administered 2018-02-24: 10 mg via INTRAVENOUS

## 2018-02-24 MED ORDER — HYDROCODONE-ACETAMINOPHEN 5-325 MG PO TABS: 1.0000 | ORAL_TABLET | Freq: Four times a day (QID) | ORAL | 0 refills | Status: DC | PRN

## 2018-02-24 MED ORDER — DEXAMETHASONE SODIUM PHOSPHATE 10 MG/ML IJ SOLN
INTRAMUSCULAR | Status: AC
  Filled 2018-02-24: qty 1

## 2018-02-24 MED ORDER — ACETAMINOPHEN 500 MG PO TABS
ORAL_TABLET | ORAL | Status: AC
  Filled 2018-02-24: qty 2

## 2018-02-24 MED ORDER — CEFAZOLIN SODIUM-DEXTROSE 2-4 GM/100ML-% IV SOLN
INTRAVENOUS | Status: AC
  Filled 2018-02-24: qty 100

## 2018-02-24 MED ORDER — PROPOFOL 10 MG/ML IV BOLUS
INTRAVENOUS | Status: DC | PRN
  Administered 2018-02-24: 200 mg via INTRAVENOUS

## 2018-02-24 MED ORDER — MEPERIDINE HCL 25 MG/ML IJ SOLN: 6.2500 mg | INTRAMUSCULAR | Status: DC | PRN

## 2018-02-24 MED ORDER — ONDANSETRON HCL 4 MG/2ML IJ SOLN
INTRAMUSCULAR | Status: AC
  Filled 2018-02-24: qty 2

## 2018-02-24 MED ORDER — BUPIVACAINE HCL (PF) 0.25 % IJ SOLN
INTRAMUSCULAR | Status: DC | PRN
  Administered 2018-02-24: 10 mL

## 2018-02-24 MED ORDER — HYDROCODONE-ACETAMINOPHEN 7.5-325 MG PO TABS: 1.0000 | ORAL_TABLET | Freq: Once | ORAL | Status: DC | PRN

## 2018-02-24 MED FILL — HYDROCODON-APAP 5-325: 5-325 | 3 days supply | Qty: 20 | Fill #0

## 2018-02-24 MED FILL — ONDANSETRON HCL 4 MG TABLET: 4 | 7 days supply | Qty: 40 | Fill #0

## 2018-02-24 SURGICAL SUPPLY — 45 items
BANDAGE ACE 3X5.8 VEL STRL LF (GAUZE/BANDAGES/DRESSINGS) ×3 IMPLANT
BLADE MINI RND TIP GREEN BEAV (BLADE) ×3 IMPLANT
BLADE SURG 15 STRL LF DISP TIS (BLADE) ×1 IMPLANT
BLADE SURG 15 STRL SS (BLADE) ×2
BNDG ESMARK 4X9 LF (GAUZE/BANDAGES/DRESSINGS) ×3 IMPLANT
BNDG PLASTER X FAST 3X3 WHT LF (CAST SUPPLIES) IMPLANT
BRUSH SCRUB EZ PLAIN DRY (MISCELLANEOUS) ×3 IMPLANT
CANISTER SUCT 1200ML W/VALVE (MISCELLANEOUS) IMPLANT
CORD BIPOLAR FORCEPS 12FT (ELECTRODE) ×3 IMPLANT
COVER BACK TABLE 60X90IN (DRAPES) ×3 IMPLANT
COVER MAYO STAND STRL (DRAPES) ×3 IMPLANT
CUFF TOURNIQUET SINGLE 18IN (TOURNIQUET CUFF) ×3 IMPLANT
DECANTER SPIKE VIAL GLASS SM (MISCELLANEOUS) IMPLANT
DRAPE EXTREMITY T 121X128X90 (DRAPE) ×3 IMPLANT
DRAPE IMP U-DRAPE 54X76 (DRAPES) ×3 IMPLANT
DRAPE SURG 17X23 STRL (DRAPES) ×3 IMPLANT
GAUZE 4X4 16PLY RFD (DISPOSABLE) IMPLANT
GAUZE SPONGE 4X4 12PLY STRL (GAUZE/BANDAGES/DRESSINGS) ×3 IMPLANT
GAUZE XEROFORM 1X8 LF (GAUZE/BANDAGES/DRESSINGS) ×3 IMPLANT
GLOVE BIOGEL PI IND STRL 7.0 (GLOVE) ×2 IMPLANT
GLOVE BIOGEL PI INDICATOR 7.0 (GLOVE) ×4
GLOVE ECLIPSE 6.5 STRL STRAW (GLOVE) ×3 IMPLANT
GLOVE ECLIPSE 7.0 STRL STRAW (GLOVE) ×3 IMPLANT
GLOVE SKINSENSE NS SZ7.5 (GLOVE) ×2
GLOVE SKINSENSE STRL SZ7.5 (GLOVE) ×1 IMPLANT
GLOVE SURG SYN 7.5  E (GLOVE) ×2
GLOVE SURG SYN 7.5 E (GLOVE) ×1 IMPLANT
GOWN STRL REIN XL XLG (GOWN DISPOSABLE) ×3 IMPLANT
GOWN STRL REUS W/ TWL XL LVL3 (GOWN DISPOSABLE) ×1 IMPLANT
GOWN STRL REUS W/TWL XL LVL3 (GOWN DISPOSABLE) ×2
NEEDLE HYPO 25X1 1.5 SAFETY (NEEDLE) ×3 IMPLANT
NS IRRIG 1000ML POUR BTL (IV SOLUTION) ×3 IMPLANT
PACK BASIN DAY SURGERY FS (CUSTOM PROCEDURE TRAY) ×3 IMPLANT
PAD CAST 3X4 CTTN HI CHSV (CAST SUPPLIES) ×1 IMPLANT
PADDING CAST COTTON 3X4 STRL (CAST SUPPLIES) ×2
RUBBERBAND STERILE (MISCELLANEOUS) ×6 IMPLANT
STOCKINETTE 4X48 STRL (DRAPES) ×3 IMPLANT
SUT ETHILON 3 0 PS 1 (SUTURE) ×3 IMPLANT
SYR BULB 3OZ (MISCELLANEOUS) ×3 IMPLANT
SYR CONTROL 10ML LL (SYRINGE) ×3 IMPLANT
TOWEL GREEN STERILE FF (TOWEL DISPOSABLE) ×3 IMPLANT
TRAY DSU PREP LF (CUSTOM PROCEDURE TRAY) ×3 IMPLANT
TUBE CONNECTING 20'X1/4 (TUBING)
TUBE CONNECTING 20X1/4 (TUBING) IMPLANT
UNDERPAD 30X30 (UNDERPADS AND DIAPERS) ×3 IMPLANT

## 2018-02-24 NOTE — Anesthesia Procedure Notes (Signed)
Procedure Name: LMA Insertion Performed by: Toniesha Zellner W, CRNA Pre-anesthesia Checklist: Patient identified, Emergency Drugs available, Suction available and Patient being monitored Patient Re-evaluated:Patient Re-evaluated prior to induction Oxygen Delivery Method: Circle system utilized Preoxygenation: Pre-oxygenation with 100% oxygen Induction Type: IV induction Ventilation: Mask ventilation without difficulty LMA: LMA inserted LMA Size: 5.0 Number of attempts: 1 Placement Confirmation: positive ETCO2 Tube secured with: Tape Dental Injury: Teeth and Oropharynx as per pre-operative assessment        

## 2018-02-24 NOTE — Op Note (Signed)
   Carpal tunnel op note  DATE OF SURGERY:02/24/2018  PREOPERATIVE DIAGNOSIS:  Right carpal tunnel syndrome  POSTOPERATIVE DIAGNOSIS: same  PROCEDURE:  Right carpal tunnel release. CPT [id number]  SURGEON: Surgeon(s): Tarry KosXu, Naiping M, MD  ASSIST: Oneal GroutMary Lindsey Stanbery, PA-C; necessary for the timely completion of procedure and due to complexity of procedure.  ANESTHESIA:  LMA general  TOURNIQUET TIME: less than 10 minutes  BLOOD LOSS: Minimal.  COMPLICATIONS: None.  PATHOLOGY: None.  INDICATIONS: The patient is a 42 y.o. -year-old male who presented with carpal tunnel syndrome failing nonsurgical management, indicated for surgical release.  DESCRIPTION OF PROCEDURE: The patient was identified in the preoperative holding area.  The operative site was marked by the surgeon and confirmed by the patient.  He was brought back to the operating room.  Anesthesia was induced by the anesthesia team.  A well padded nonsterile tourniquet was placed. The operative extremity was prepped and draped in standard sterile fashion.  A timeout was performed.  Preoperative antibiotics were given.   A palmar incision was made about 5 mm ulnar to the thenar crease.  The palmar aponeurosis was exposed and divided in line with the skin incision. The palmaris brevis was visualized and divided.  The distal edge of the transcarpal ligament was identified. A hemostat was inserted into the carpal tunnel to protect the median nerve and the flexor tendons. Then, the transverse carpal ligament was released under direct visualization. Proximally, a subcutaneous tunnel was made allowing a Sewell retractor to be placed. Then, the distal portion of the antebrachial fascia was released. Distally, all fibrous bands were released. The median nerve was visualized, and the fat pad was exposed. Following release, local infiltration with 0.25% of Sensorcaine was given. The tourniquet was deflated. Hemostasis achieved.  Wound was  irrigated and closed with 4-0 nylon sutures. Sterile dressing applied. The patient was transferred to the recovery room in stable condition after all counts were correct.  POSTOPERATIVE PLAN: To start nerve gliding exercises as tolerated and no heavy lifting for four weeks.

## 2018-02-24 NOTE — Anesthesia Postprocedure Evaluation (Signed)
Anesthesia Post Note  Patient: John Vaughn  Procedure(s) Performed: RIGHT CARPAL TUNNEL RELEASE (Right Hand)     Patient location during evaluation: PACU Anesthesia Type: General Level of consciousness: awake and alert Pain management: pain level controlled Vital Signs Assessment: post-procedure vital signs reviewed and stable Respiratory status: spontaneous breathing, nonlabored ventilation, respiratory function stable and patient connected to nasal cannula oxygen Cardiovascular status: blood pressure returned to baseline and stable Postop Assessment: no apparent nausea or vomiting Anesthetic complications: no    Last Vitals:  Vitals:   02/24/18 1300 02/24/18 1326  BP: 116/81 129/87  Pulse: 61 65  Resp: 13 16  Temp:  36.7 C  SpO2: 96% 100%    Last Pain:  Vitals:   02/24/18 1326  TempSrc:   PainSc: 0-No pain                 Trevor IhaStephen A Houser

## 2018-02-24 NOTE — Anesthesia Procedure Notes (Signed)
Anesthesia Regional Block: Narrative:       

## 2018-02-24 NOTE — Discharge Instructions (Signed)
Postoperative instructions:  Weightbearing instructions: no heavy lifting, begin finger range of motion immediately  Dressing instructions: Keep your dressing and/or splint clean and dry at all times.  It will be removed at your first post-operative appointment.  Your stitches and/or staples will be removed at this visit.  Incision instructions:  Do not soak your incision for 3 weeks after surgery.  If the incision gets wet, pat dry and do not scrub the incision.  Pain control:  You have been given a prescription to be taken as directed for post-operative pain control.  In addition, elevate the operative extremity above the heart at all times to prevent swelling and throbbing pain.  Take over-the-counter Colace, 100mg  by mouth twice a day while taking narcotic pain medications to help prevent constipation.  Follow up appointments: 1) 14 days for suture removal and wound check. 2) Dr. Roda Shutters as scheduled.   -------------------------------------------------------------------------------------------------------------  After Surgery Pain Control:  After your surgery, post-surgical discomfort or pain is likely. This discomfort can last several days to a few weeks. At certain times of the day your discomfort may be more intense.  Did you receive a nerve block?  A nerve block can provide pain relief for one hour to two days after your surgery. As long as the nerve block is working, you will experience little or no sensation in the area the surgeon operated on.  As the nerve block wears off, you will begin to experience pain or discomfort. It is very important that you begin taking your prescribed pain medication before the nerve block fully wears off. Treating your pain at the first sign of the block wearing off will ensure your pain is better controlled and more tolerable when full-sensation returns. Do not wait until the pain is intolerable, as the medicine will be less effective. It is better to treat  pain in advance than to try and catch up.  General Anesthesia:  If you did not receive a nerve block during your surgery, you will need to start taking your pain medication shortly after your surgery and should continue to do so as prescribed by your surgeon.  Pain Medication:  Most commonly we prescribe Vicodin and Percocet for post-operative pain. Both of these medications contain a combination of acetaminophen (Tylenol) and a narcotic to help control pain.   It takes between 30 and 45 minutes before pain medication starts to work. It is important to take your medication before your pain level gets too intense.   Nausea is a common side effect of many pain medications. You will want to eat something before taking your pain medicine to help prevent nausea.   If you are taking a prescription pain medication that contains acetaminophen, we recommend that you do not take additional over the counter acetaminophen (Tylenol).  Other pain relieving options:   Using a cold pack to ice the affected area a few times a day (15 to 20 minutes at a time) can help to relieve pain, reduce swelling and bruising.   Elevation of the affected area can also help to reduce pain and swelling.     Post Anesthesia Home Care Instructions  Activity: Get plenty of rest for the remainder of the day. A responsible individual must stay with you for 24 hours following the procedure.  For the next 24 hours, DO NOT: -Drive a car -Advertising copywriter -Drink alcoholic beverages -Take any medication unless instructed by your physician -Make any legal decisions or sign important papers.  Meals: Start  with liquid foods such as gelatin or soup. Progress to regular foods as tolerated. Avoid greasy, spicy, heavy foods. If nausea and/or vomiting occur, drink only clear liquids until the nausea and/or vomiting subsides. Call your physician if vomiting continues.  Special Instructions/Symptoms: Your throat may feel dry or  sore from the anesthesia or the breathing tube placed in your throat during surgery. If this causes discomfort, gargle with warm salt water. The discomfort should disappear within 24 hours.  If you had a scopolamine patch placed behind your ear for the management of post- operative nausea and/or vomiting:  1. The medication in the patch is effective for 72 hours, after which it should be removed.  Wrap patch in a tissue and discard in the trash. Wash hands thoroughly with soap and water. 2. You may remove the patch earlier than 72 hours if you experience unpleasant side effects which may include dry mouth, dizziness or visual disturbances. 3. Avoid touching the patch. Wash your hands with soap and water after contact with the patch.

## 2018-02-24 NOTE — H&P (Signed)
PREOPERATIVE H&P  Chief Complaint: right carpal tunnel syndrome  HPI: John Vaughn is a 42 y.o. male who presents for surgical treatment of right carpal tunnel syndrome.  He denies any changes in medical history.  No past medical history on file. Past Surgical History:  Procedure Laterality Date  . WISDOM TOOTH EXTRACTION     Social History   Socioeconomic History  . Marital status: Married    Spouse name: Not on file  . Number of children: Not on file  . Years of education: Not on file  . Highest education level: Not on file  Occupational History  . Not on file  Social Needs  . Financial resource strain: Not on file  . Food insecurity:    Worry: Not on file    Inability: Not on file  . Transportation needs:    Medical: Not on file    Non-medical: Not on file  Tobacco Use  . Smoking status: Never Smoker  . Smokeless tobacco: Never Used  Substance and Sexual Activity  . Alcohol use: Yes    Comment: 1-2 drinks a week  . Drug use: Never  . Sexual activity: Not on file  Lifestyle  . Physical activity:    Days per week: Not on file    Minutes per session: Not on file  . Stress: Not on file  Relationships  . Social connections:    Talks on phone: Not on file    Gets together: Not on file    Attends religious service: Not on file    Active member of club or organization: Not on file    Attends meetings of clubs or organizations: Not on file    Relationship status: Not on file  Other Topics Concern  . Not on file  Social History Narrative  . Not on file   No family history on file. No Known Allergies Prior to Admission medications   Medication Sig Start Date End Date Taking? Authorizing Provider  ibuprofen (ADVIL,MOTRIN) 200 MG tablet Take 200 mg by mouth every 6 (six) hours as needed.   Yes [provider]  Multiple Vitamin (MULTIVITAMINS PO) Take by mouth.   Yes [provider]  OVER THE COUNTER MEDICATION Protein Shakes a couple  times a week   Yes [provider]  Vitamin D, Ergocalciferol, (DRISDOL) 50000 units CAPS capsule Take 50,000 Units by mouth every 7 (seven) days.   Yes [provider]  atorvastatin (LIPITOR) 40 MG tablet TAKE 1 TABLET BY MOUTH AT BEDTIME 11/23/16   [provider]  valACYclovir (VALTREX) 500 MG tablet Take by mouth.    [provider]     Positive ROS: All other systems have been reviewed and were otherwise negative with the exception of those mentioned in the HPI and as above.  Physical Exam: General: Alert, no acute distress Cardiovascular: No pedal edema Respiratory: No cyanosis, no use of accessory musculature GI: abdomen soft Skin: No lesions in the area of chief complaint Neurologic: Sensation intact distally Psychiatric: Patient is competent for consent with normal mood and affect Lymphatic: no lymphedema  MUSCULOSKELETAL: exam stable  Assessment: right carpal tunnel syndrome  Plan: Plan for Procedure(s): RIGHT CARPAL TUNNEL RELEASE  The risks benefits and alternatives were discussed with the patient including but not limited to the risks of nonoperative treatment, versus surgical intervention including infection, bleeding, nerve injury,  blood clots, cardiopulmonary complications, morbidity, mortality, among others, and they were willing to proceed.   Casimiro Needle  Roda ShuttersXu, MD   02/24/2018 8:12 AM

## 2018-02-24 NOTE — Transfer of Care (Signed)
Immediate Anesthesia Transfer of Care Note  Patient: John Vaughn  Procedure(s) Performed: RIGHT CARPAL TUNNEL RELEASE (Right Hand)  Patient Location: PACU  Anesthesia Type:General  Level of Consciousness: awake and sedated  Airway & Oxygen Therapy: Patient Spontanous Breathing and Patient connected to face mask oxygen  Post-op Assessment: Report given to RN and Post -op Vital signs reviewed and stable  Post vital signs: Reviewed and stable  Last Vitals:  Vitals Value Taken Time  BP 114/75 02/24/2018 12:27 PM  Temp    Pulse 72 02/24/2018 12:27 PM  Resp 15 02/24/2018 12:27 PM  SpO2 95 % 02/24/2018 12:27 PM  Vitals shown include unvalidated device data.  Last Pain:  Vitals:   02/24/18 1055  TempSrc: Oral  PainSc: 1       Patients Stated Pain Goal: 1 (02/24/18 1055)  Complications: No apparent anesthesia complications

## 2018-03-17 ENCOUNTER — Encounter (INDEPENDENT_AMBULATORY_CARE_PROVIDER_SITE_OTHER): Payer: Self-pay | Admitting: Orthopaedic Surgery

## 2018-03-17 ENCOUNTER — Ambulatory Visit (INDEPENDENT_AMBULATORY_CARE_PROVIDER_SITE_OTHER): Payer: 59 | Admitting: Orthopaedic Surgery

## 2018-03-17 DIAGNOSIS — Z9889 Other specified postprocedural states: Secondary | ICD-10-CM | POA: Insufficient documentation

## 2018-03-17 DIAGNOSIS — G5602 Carpal tunnel syndrome, left upper limb: Secondary | ICD-10-CM | POA: Diagnosis not present

## 2018-03-17 MED ORDER — MUPIROCIN 2 % EX OINT: TOPICAL_OINTMENT | CUTANEOUS | 0 refills | Status: DC

## 2018-03-17 NOTE — Progress Notes (Signed)
Office Visit Note   Patient: John Vaughn           Date of Birth: 12/10/1975           MRN: [id number] Visit Date: 03/17/2018              Requested by: Lyndel Vaughn, John E., MD No address on file PCP: Lyndel Vaughn, John E., MD   Assessment & Plan: Visit Diagnoses:  1. S/P carpal tunnel release   2. Left carpal tunnel syndrome     Plan: Impression is status post right carpal tunnel release.  #2 Mild Left Carpal Tunnel syndrome s/p injection with 5-6 weeks of relief.  In regards to the right wrist, nylon sutures were removed today.  We will apply mupirocin as well as a Band-Aid.  I have called in a prescription of mupirocin for which she will apply twice daily covering his wound with a Band-Aid.  He will do this for the next few weeks.  Call with concerning symptoms in the meantime.  Follow-up in 3 weeks time for recheck.  No heavy lifting in the meantime.  In regards to the left hand, he would like to proceed with left carpal tunnel release.  We will schedule this at his fu appt  Follow-Up Instructions: Return in about 3 weeks (around 04/07/2018).   Orders:  No orders of the defined types were placed in this encounter.  Meds ordered this encounter  Medications  . mupirocin ointment (BACTROBAN) 2 %    Sig: Apply to affected area 2 times daily until follow up appointment    Dispense:  22 g    Refill:  0      Procedures: No procedures performed   Clinical Data: No additional findings.   Subjective: Chief Complaint  Patient presents with  . Right Wrist - Routine Post Op    HPI patient is a pleasant 42 year old gentleman who presents to our clinic today 3 weeks status post right carpal tunnel release, date of surgery 02/24/2018.  Doing well.  He notes that his numbness has completely resolved.  No pain.  He has not regained full strength but this is getting better.  He does note some soreness and stiffness in his right long finger.  No fevers, chills or any other systemic  symptoms.  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  He does mention his left hand today.  History of left median nerve entrapment which has been injected in the past with cortisone.  This is been of relatively good relief until recently.  He has been wearing a wrist splint to help with symptoms.  At this point, he is ready to proceed with left carpal tunnel release.  Review of Systems as detailed in HPI.  All others reviewed and are negative.   Objective: Vital Signs: There were no vitals taken for this visit.  Physical Exam well-developed well-nourished gentleman no acute distress.  Alert and oriented x3.  Ortho Exam examination of the right hand reveals a well-healing surgical incision with nylon sutures in place.  It does appear that the most distal aspect of the incision is starting to slightly pull apart, but there is no evidence of infection or cellulitis or drainage.  In regards to the left hand, positive Phalen and positive Tinel.  Specialty Comments:  No specialty comments available.  Imaging: No new imaging   PMFS History: Patient Active Problem  List   Diagnosis Date Noted  . S/P carpal tunnel release 03/17/2018  . Left carpal tunnel syndrome 03/17/2018  . Carpal tunnel syndrome on right   . Carpal tunnel syndrome, right upper limb 09/16/2017   History reviewed. No pertinent past medical history.  History reviewed. No pertinent family history.  Past Surgical History:  Procedure Laterality Date  . CARPAL TUNNEL RELEASE Right 02/24/2018   Procedure: RIGHT CARPAL TUNNEL RELEASE;  Surgeon: Tarry Kos, MD;  Location: [facility];  Service: Orthopedics;  Laterality: Right;  . WISDOM TOOTH EXTRACTION     Social History   Occupational History  . Not on file  Tobacco Use  . Smoking status: Never Smoker  . Smokeless tobacco: Never Used  Substance and Sexual Activity  . Alcohol use: Yes      Comment: 1-2 drinks a week  . Drug use: Never  . Sexual activity: Not on file

## 2018-04-07 ENCOUNTER — Ambulatory Visit (INDEPENDENT_AMBULATORY_CARE_PROVIDER_SITE_OTHER): Payer: 59 | Admitting: Orthopaedic Surgery

## 2018-04-07 ENCOUNTER — Encounter (INDEPENDENT_AMBULATORY_CARE_PROVIDER_SITE_OTHER): Payer: Self-pay | Admitting: Orthopaedic Surgery

## 2018-04-07 DIAGNOSIS — G5602 Carpal tunnel syndrome, left upper limb: Secondary | ICD-10-CM | POA: Diagnosis not present

## 2018-04-07 DIAGNOSIS — Z9889 Other specified postprocedural states: Secondary | ICD-10-CM

## 2018-04-07 DIAGNOSIS — M19041 Primary osteoarthritis, right hand: Secondary | ICD-10-CM

## 2018-04-07 DIAGNOSIS — M19042 Primary osteoarthritis, left hand: Secondary | ICD-10-CM | POA: Diagnosis not present

## 2018-04-07 MED ORDER — DICLOFENAC SODIUM 1 % TD GEL: 2.0000 g | Freq: Four times a day (QID) | TRANSDERMAL | 5 refills | Status: DC

## 2018-04-07 MED ORDER — CELECOXIB 200 MG PO CAPS: 200.0000 mg | ORAL_CAPSULE | Freq: Two times a day (BID) | ORAL | 3 refills | Status: DC

## 2018-04-07 NOTE — Progress Notes (Signed)
Office Visit Note   Patient: John Vaughn           Date of Birth: [birth year]           MRN: [id number] Visit Date: 04/07/2018              Requested by: Lyndel Safe., MD [address] [place], [place] [address] PCP: Lyndel Safe., MD   Assessment & Plan: Visit Diagnoses:  1. S/P carpal tunnel release   2. Left carpal tunnel syndrome   3. Primary osteoarthritis of hands, bilateral     Plan: Impression is #1 status post right carpal tunnel release and doing very well.  May resume activities as tolerated.  #2 left carpal tunnel syndrome at this point we will schedule for next available for left carpal tunnel release.  Patient is having significant symptoms and difficulty sleeping due to this.  He is also having trouble with his left hand at work.  #3 bilateral hand osteoarthritis.  We discussed treatment options in terms of over-the-counter alternative options and NSAIDs both oral and topical.  Prescriptions for Celebrex and Voltaren gel.  Questions encouraged and answered.  Follow-Up Instructions: Return for 2 week postop visit.   Orders:  No orders of the defined types were placed in this encounter.  Meds ordered this encounter  Medications  . celecoxib (CELEBREX) 200 MG capsule    Sig: Take 1 capsule (200 mg total) by mouth 2 (two) times daily.    Dispense:  30 capsule    Refill:  3  . diclofenac sodium (VOLTAREN) 1 % GEL    Sig: Apply 2 g topically 4 (four) times daily.    Dispense:  1 Tube    Refill:  5      Procedures: No procedures performed   Clinical Data: No additional findings.   Subjective: Chief Complaint  Patient presents with  . Right Hand - Follow-up  . Left Hand - Pain    John Vaughn is a 42 year old gentleman comes in for several issues.  He is status post right carpal tunnel release on 02/24/2018.  He is doing very well he is very happy.  His symptoms are completely gone.  He just complains of some scar tenderness.  He would  like to have the left carpal tunnel release done ASAP.  He is having trouble sleeping at night due to the symptoms.  He uses hands a lot for work.  He also is complaining of a new problem of bilateral hand stiffness is worse in the morning it takes about 30 to 60 minutes to feel better and work itself out.  He denies any triggering.  It affects all 10 fingers.  Denies any injuries.  Denies any swelling.   Review of Systems  Constitutional: Negative.   All other systems reviewed and are negative.    Objective: Vital Signs: There were no vitals taken for this visit.  Physical Exam  Constitutional: He is oriented to person, place, and time. He appears well-developed and well-nourished.  Pulmonary/Chest: Effort normal.  Abdominal: Soft.  Neurological: He is alert and oriented to person, place, and time.  Skin: Skin is warm.  Psychiatric: He has a normal mood and affect. His behavior is normal. Judgment and thought content normal.  Nursing note and vitals reviewed.   Ortho Exam Right hand exam shows a fully healed surgical scar with minimal tenderness.  Neurovascular intact.  He has no triggering of the fingers. Left hand exam shows  no triggering.  Overall just some subjective stiffness. Specialty Comments:  No specialty comments available.  Imaging: No results found.   PMFS History: Patient Active Problem List   Diagnosis Date Noted  . S/P carpal tunnel release 03/17/2018  . Left carpal tunnel syndrome 03/17/2018  . Carpal tunnel syndrome on right   . Carpal tunnel syndrome, right upper limb 09/16/2017   History reviewed. No pertinent past medical history.  History reviewed. No pertinent family history.  Past Surgical History:  Procedure Laterality Date  . CARPAL TUNNEL RELEASE Right 02/24/2018   Procedure: RIGHT CARPAL TUNNEL RELEASE;  Surgeon: Tarry Kos, MD;  Location: [facility];  Service: Orthopedics;  Laterality: Right;  . WISDOM TOOTH EXTRACTION      Social History   Occupational History  . Not on file  Tobacco Use  . Smoking status: Never Smoker  . Smokeless tobacco: Never Used  Substance and Sexual Activity  . Alcohol use: Yes    Comment: 1-2 drinks a week  . Drug use: Never  . Sexual activity: Not on file

## 2018-04-13 ENCOUNTER — Encounter (HOSPITAL_BASED_OUTPATIENT_CLINIC_OR_DEPARTMENT_OTHER): Payer: Self-pay | Admitting: *Deleted

## 2018-04-13 ENCOUNTER — Other Ambulatory Visit: Payer: Self-pay

## 2018-04-21 ENCOUNTER — Other Ambulatory Visit: Payer: Self-pay

## 2018-04-21 ENCOUNTER — Encounter (HOSPITAL_BASED_OUTPATIENT_CLINIC_OR_DEPARTMENT_OTHER): Payer: Self-pay | Admitting: Anesthesiology

## 2018-04-21 ENCOUNTER — Encounter (HOSPITAL_BASED_OUTPATIENT_CLINIC_OR_DEPARTMENT_OTHER)
Admission: RE | Disposition: A | Discharge status: Home / Self Care | Payer: Self-pay | Source: Ambulatory Visit | Attending: Orthopaedic Surgery

## 2018-04-21 ENCOUNTER — Ambulatory Visit (HOSPITAL_BASED_OUTPATIENT_CLINIC_OR_DEPARTMENT_OTHER): Payer: 59 | Admitting: Anesthesiology

## 2018-04-21 ENCOUNTER — Ambulatory Visit (HOSPITAL_BASED_OUTPATIENT_CLINIC_OR_DEPARTMENT_OTHER)
Admission: RE | Admit: 2018-04-21 | Discharge: 2018-04-21 | Disposition: A | Discharge status: Home / Self Care | Payer: 59 | Source: Ambulatory Visit | Attending: Orthopaedic Surgery | Admitting: Orthopaedic Surgery

## 2018-04-21 DIAGNOSIS — Z6831 Body mass index (BMI) 31.0-31.9, adult: Secondary | ICD-10-CM | POA: Insufficient documentation

## 2018-04-21 DIAGNOSIS — Z79899 Other long term (current) drug therapy: Secondary | ICD-10-CM | POA: Insufficient documentation

## 2018-04-21 DIAGNOSIS — G5602 Carpal tunnel syndrome, left upper limb: Secondary | ICD-10-CM | POA: Insufficient documentation

## 2018-04-21 DIAGNOSIS — M1990 Unspecified osteoarthritis, unspecified site: Secondary | ICD-10-CM | POA: Diagnosis not present

## 2018-04-21 DIAGNOSIS — E669 Obesity, unspecified: Secondary | ICD-10-CM | POA: Insufficient documentation

## 2018-04-21 HISTORY — PX: CARPAL TUNNEL RELEASE: SHX101

## 2018-04-21 HISTORY — DX: Unspecified osteoarthritis, unspecified site: M19.90

## 2018-04-21 SURGERY — CARPAL TUNNEL RELEASE
Anesthesia: Regional | Site: Hand | Laterality: Left

## 2018-04-21 MED ORDER — LIDOCAINE HCL (CARDIAC) PF 100 MG/5ML IV SOSY
PREFILLED_SYRINGE | INTRAVENOUS | Status: DC | PRN
  Administered 2018-04-21: 50 mg via INTRAVENOUS

## 2018-04-21 MED ORDER — PROMETHAZINE HCL 25 MG/ML IJ SOLN: 6.2500 mg | INTRAMUSCULAR | Status: DC | PRN

## 2018-04-21 MED ORDER — CHLORHEXIDINE GLUCONATE 4 % EX LIQD: 60.0000 mL | Freq: Once | CUTANEOUS | Status: DC

## 2018-04-21 MED ORDER — ONDANSETRON HCL 4 MG/2ML IJ SOLN
INTRAMUSCULAR | Status: DC | PRN
  Administered 2018-04-21: 4 mg via INTRAVENOUS

## 2018-04-21 MED ORDER — ONDANSETRON HCL 4 MG/2ML IJ SOLN
INTRAMUSCULAR | Status: AC
  Filled 2018-04-21: qty 2

## 2018-04-21 MED ORDER — MIDAZOLAM HCL 2 MG/2ML IJ SOLN
INTRAMUSCULAR | Status: AC
  Filled 2018-04-21: qty 2

## 2018-04-21 MED ORDER — LACTATED RINGERS IV SOLN
INTRAVENOUS | Status: DC
  Administered 2018-04-21: 14:00:00 via INTRAVENOUS

## 2018-04-21 MED ORDER — SCOPOLAMINE 1 MG/3DAYS TD PT72: 1.0000 | MEDICATED_PATCH | Freq: Once | TRANSDERMAL | Status: DC | PRN

## 2018-04-21 MED ORDER — MIDAZOLAM HCL 2 MG/2ML IJ SOLN
1.0000 mg | INTRAMUSCULAR | Status: DC | PRN
  Administered 2018-04-21: 2 mg via INTRAVENOUS

## 2018-04-21 MED ORDER — CEFAZOLIN SODIUM-DEXTROSE 2-4 GM/100ML-% IV SOLN
2.0000 g | INTRAVENOUS | Status: AC
  Administered 2018-04-21: 2 g via INTRAVENOUS

## 2018-04-21 MED ORDER — HYDROCODONE-ACETAMINOPHEN 5-325 MG PO TABS: 1.0000 | ORAL_TABLET | Freq: Two times a day (BID) | ORAL | 0 refills | Status: DC | PRN

## 2018-04-21 MED ORDER — LIDOCAINE HCL (PF) 0.5 % IJ SOLN
INTRAMUSCULAR | Status: DC | PRN
  Administered 2018-04-21: 35 mL via INTRAVENOUS

## 2018-04-21 MED ORDER — PROPOFOL 10 MG/ML IV BOLUS
INTRAVENOUS | Status: DC | PRN
  Administered 2018-04-21 (×2): 20 mg via INTRAVENOUS

## 2018-04-21 MED ORDER — KETOROLAC TROMETHAMINE 30 MG/ML IJ SOLN
INTRAMUSCULAR | Status: AC
  Filled 2018-04-21: qty 1

## 2018-04-21 MED ORDER — CEFAZOLIN SODIUM-DEXTROSE 2-4 GM/100ML-% IV SOLN
INTRAVENOUS | Status: AC
  Filled 2018-04-21: qty 100

## 2018-04-21 MED ORDER — LACTATED RINGERS IV SOLN: INTRAVENOUS | Status: DC

## 2018-04-21 MED ORDER — ONDANSETRON HCL 4 MG PO TABS: 4.0000 mg | ORAL_TABLET | Freq: Three times a day (TID) | ORAL | 0 refills | Status: DC | PRN

## 2018-04-21 MED ORDER — KETOROLAC TROMETHAMINE 30 MG/ML IJ SOLN
INTRAMUSCULAR | Status: DC | PRN
  Administered 2018-04-21: 30 mg via INTRAVENOUS

## 2018-04-21 MED ORDER — FENTANYL CITRATE (PF) 100 MCG/2ML IJ SOLN: 25.0000 ug | INTRAMUSCULAR | Status: DC | PRN

## 2018-04-21 MED ORDER — DEXAMETHASONE SODIUM PHOSPHATE 10 MG/ML IJ SOLN
INTRAMUSCULAR | Status: AC
  Filled 2018-04-21: qty 1

## 2018-04-21 MED ORDER — BUPIVACAINE HCL (PF) 0.25 % IJ SOLN
INTRAMUSCULAR | Status: DC | PRN
  Administered 2018-04-21: 20 mL

## 2018-04-21 MED ORDER — FENTANYL CITRATE (PF) 100 MCG/2ML IJ SOLN
50.0000 ug | INTRAMUSCULAR | Status: DC | PRN
  Administered 2018-04-21 (×2): 50 ug via INTRAVENOUS

## 2018-04-21 MED ORDER — FENTANYL CITRATE (PF) 100 MCG/2ML IJ SOLN
INTRAMUSCULAR | Status: AC
  Filled 2018-04-21: qty 2

## 2018-04-21 MED FILL — HYDROCODON-APAP 5-325: 5-325 | 5 days supply | Qty: 20 | Fill #0

## 2018-04-21 MED FILL — CELECOXIB 200 MG CAP: 200 | 15 days supply | Qty: 30 | Fill #0

## 2018-04-21 MED FILL — ATORVASTATIN 40 MG TABLET: 40 | 30 days supply | Qty: 30 | Fill #1

## 2018-04-21 SURGICAL SUPPLY — 48 items
BANDAGE ACE 3X5.8 VEL STRL LF (GAUZE/BANDAGES/DRESSINGS) ×3 IMPLANT
BLADE MINI RND TIP GREEN BEAV (BLADE) ×3 IMPLANT
BLADE SURG 15 STRL LF DISP TIS (BLADE) ×1 IMPLANT
BLADE SURG 15 STRL SS (BLADE) ×2
BNDG ESMARK 4X9 LF (GAUZE/BANDAGES/DRESSINGS) ×3 IMPLANT
BNDG PLASTER X FAST 3X3 WHT LF (CAST SUPPLIES) IMPLANT
BRUSH SCRUB EZ PLAIN DRY (MISCELLANEOUS) ×3 IMPLANT
CANISTER SUCT 1200ML W/VALVE (MISCELLANEOUS) ×3 IMPLANT
CORD BIPOLAR FORCEPS 12FT (ELECTRODE) ×3 IMPLANT
COVER BACK TABLE 60X90IN (DRAPES) ×3 IMPLANT
COVER MAYO STAND STRL (DRAPES) ×3 IMPLANT
COVER WAND RF STERILE (DRAPES) IMPLANT
CUFF TOURNIQUET SINGLE 18IN (TOURNIQUET CUFF) ×3 IMPLANT
DECANTER SPIKE VIAL GLASS SM (MISCELLANEOUS) IMPLANT
DRAPE EXTREMITY T 121X128X90 (DRAPE) ×3 IMPLANT
DRAPE IMP U-DRAPE 54X76 (DRAPES) ×3 IMPLANT
DRAPE SURG 17X23 STRL (DRAPES) ×3 IMPLANT
GAUZE 4X4 16PLY RFD (DISPOSABLE) IMPLANT
GAUZE SPONGE 4X4 12PLY STRL (GAUZE/BANDAGES/DRESSINGS) ×3 IMPLANT
GAUZE XEROFORM 1X8 LF (GAUZE/BANDAGES/DRESSINGS) ×3 IMPLANT
GLOVE BIOGEL PI IND STRL 7.0 (GLOVE) ×1 IMPLANT
GLOVE BIOGEL PI IND STRL 8 (GLOVE) ×1 IMPLANT
GLOVE BIOGEL PI INDICATOR 7.0 (GLOVE) ×2
GLOVE BIOGEL PI INDICATOR 8 (GLOVE) ×2
GLOVE ECLIPSE 7.0 STRL STRAW (GLOVE) ×3 IMPLANT
GLOVE SKINSENSE NS SZ7.5 (GLOVE) ×2
GLOVE SKINSENSE STRL SZ7.5 (GLOVE) ×1 IMPLANT
GLOVE SURG SYN 7.5  E (GLOVE) ×2
GLOVE SURG SYN 7.5 E (GLOVE) ×1 IMPLANT
GLOVE SURG SYN 8.0 (GLOVE) ×3 IMPLANT
GOWN STRL REIN XL XLG (GOWN DISPOSABLE) ×6 IMPLANT
GOWN STRL REUS W/ TWL XL LVL3 (GOWN DISPOSABLE) ×1 IMPLANT
GOWN STRL REUS W/TWL XL LVL3 (GOWN DISPOSABLE) ×2
NEEDLE HYPO 25X1 1.5 SAFETY (NEEDLE) ×3 IMPLANT
NS IRRIG 1000ML POUR BTL (IV SOLUTION) ×3 IMPLANT
PACK BASIN DAY SURGERY FS (CUSTOM PROCEDURE TRAY) ×3 IMPLANT
PAD CAST 3X4 CTTN HI CHSV (CAST SUPPLIES) ×1 IMPLANT
PADDING CAST COTTON 3X4 STRL (CAST SUPPLIES) ×2
RUBBERBAND STERILE (MISCELLANEOUS) ×6 IMPLANT
STOCKINETTE 4X48 STRL (DRAPES) ×3 IMPLANT
SUT ETHILON 3 0 PS 1 (SUTURE) ×3 IMPLANT
SYR BULB 3OZ (MISCELLANEOUS) ×3 IMPLANT
SYR CONTROL 10ML LL (SYRINGE) ×3 IMPLANT
TOWEL GREEN STERILE FF (TOWEL DISPOSABLE) ×3 IMPLANT
TRAY DSU PREP LF (CUSTOM PROCEDURE TRAY) ×3 IMPLANT
TUBE CONNECTING 20'X1/4 (TUBING)
TUBE CONNECTING 20X1/4 (TUBING) IMPLANT
UNDERPAD 30X30 (UNDERPADS AND DIAPERS) IMPLANT

## 2018-04-21 NOTE — H&P (Signed)
PREOPERATIVE H&P  Chief Complaint: left carpal tunnel syndrome  HPI: John Vaughn is a 42 y.o. male who presents for surgical treatment of left carpal tunnel syndrome.  He denies any changes in medical history.  Past Medical History:  Diagnosis Date  . Arthritis    Past Surgical History:  Procedure Laterality Date  . CARPAL TUNNEL RELEASE Right 02/24/2018   Procedure: RIGHT CARPAL TUNNEL RELEASE;  Surgeon: Tarry Kos, MD;  Location: [facility];  Service: Orthopedics;  Laterality: Right;  . WISDOM TOOTH EXTRACTION     Social History   Socioeconomic History  . Marital status: Married    Spouse name: Not on file  . Number of children: Not on file  . Years of education: Not on file  . Highest education level: Not on file  Occupational History  . Not on file  Social Needs  . Financial resource strain: Not on file  . Food insecurity:    Worry: Not on file    Inability: Not on file  . Transportation needs:    Medical: Not on file    Non-medical: Not on file  Tobacco Use  . Smoking status: Never Smoker  . Smokeless tobacco: Never Used  Substance and Sexual Activity  . Alcohol use: Yes    Comment: 1-2 drinks a week  . Drug use: Never  . Sexual activity: Not on file  Lifestyle  . Physical activity:    Days per week: Not on file    Minutes per session: Not on file  . Stress: Not on file  Relationships  . Social connections:    Talks on phone: Not on file    Gets together: Not on file    Attends religious service: Not on file    Active member of club or organization: Not on file    Attends meetings of clubs or organizations: Not on file    Relationship status: Not on file  Other Topics Concern  . Not on file  Social History Narrative  . Not on file   History reviewed. No pertinent family history. No Known Allergies Prior to Admission medications   Medication Sig Start Date End Date Taking? Authorizing Provider  atorvastatin (LIPITOR) 40  MG tablet TAKE 1 TABLET BY MOUTH AT BEDTIME 11/23/16  Yes [provider]  ibuprofen (ADVIL,MOTRIN) 200 MG tablet Take 200 mg by mouth every 6 (six) hours as needed.   Yes [provider]  Multiple Vitamin (MULTIVITAMINS PO) Take by mouth.   Yes [provider]  OVER THE COUNTER MEDICATION Protein Shakes a couple times a week   Yes [provider]  Vitamin D, Ergocalciferol, (DRISDOL) 50000 units CAPS capsule Take 50,000 Units by mouth every 7 (seven) days.   Yes [provider]  valACYclovir (VALTREX) 500 MG tablet Take by mouth.    [provider]     Positive ROS: All other systems have been reviewed and were otherwise negative with the exception of those mentioned in the HPI and as above.  Physical Exam: General: Alert, no acute distress Cardiovascular: No pedal edema Respiratory: No cyanosis, no use of accessory musculature GI: abdomen soft Skin: No lesions in the area of chief complaint Neurologic: Sensation intact distally Psychiatric: Patient is competent for consent with normal mood and affect Lymphatic: no lymphedema  MUSCULOSKELETAL: exam stable  Assessment: left carpal tunnel syndrome  Plan: Plan for Procedure(s): LEFT CARPAL TUNNEL RELEASE  The risks benefits and alternatives were discussed  with the patient including but not limited to the risks of nonoperative treatment, versus surgical intervention including infection, bleeding, nerve injury,  blood clots, cardiopulmonary complications, morbidity, mortality, among others, and they were willing to proceed.    Glee Arvin, MD   04/21/2018 2:09 PM

## 2018-04-21 NOTE — Anesthesia Procedure Notes (Signed)
Procedure Name: MAC Performed by: Terrance Mass, CRNA Pre-anesthesia Checklist: Timeout performed, Patient identified, Emergency Drugs available, Suction available and Patient being monitored Oxygen Delivery Method: Simple face mask

## 2018-04-21 NOTE — Anesthesia Procedure Notes (Signed)
Anesthesia Regional Block: Bier block (IV Regional)   Pre-Anesthetic Checklist: ,, timeout performed, Correct Patient, Correct Site, Correct Laterality, Correct Procedure,, site marked, surgical consent,, at surgeon's request  Laterality: Left     Needles:  Injection technique: Single-shot  Needle Type: Other      Needle Gauge: 20     Additional Needles:   Procedures:,,,,, intact distal pulses, Esmarch exsanguination, single tourniquet utilized,  Narrative:   Performed by: Personally       

## 2018-04-21 NOTE — Discharge Instructions (Signed)
Postoperative instructions: ° °Weightbearing instructions: no heavy lifting ° °Dressing instructions: Keep your dressing and/or splint clean and dry at all times.  It will be removed at your first post-operative appointment.  Your stitches and/or staples will be removed at this visit. ° °Incision instructions:  Do not soak your incision for 3 weeks after surgery.  If the incision gets wet, pat dry and do not scrub the incision. ° °Pain control:  You have been given a prescription to be taken as directed for post-operative pain control.  In addition, elevate the operative extremity above the heart at all times to prevent swelling and throbbing pain. ° °Take over-the-counter Colace, 100mg by mouth twice a day while taking narcotic pain medications to help prevent constipation. ° °Follow up appointments: °1) 12-14 days for suture removal and wound check. °2) Dr. Xu as scheduled. ° ° ------------------------------------------------------------------------------------------------------------- ° °After Surgery Pain Control: ° °After your surgery, post-surgical discomfort or pain is likely. This discomfort can last several days to a few weeks. At certain times of the day your discomfort may be more intense.  °Did you receive a nerve block?  °A nerve block can provide pain relief for one hour to two days after your surgery. As long as the nerve block is working, you will experience little or no sensation in the area the surgeon operated on.  °As the nerve block wears off, you will begin to experience pain or discomfort. It is very important that you begin taking your prescribed pain medication before the nerve block fully wears off. Treating your pain at the first sign of the block wearing off will ensure your pain is better controlled and more tolerable when full-sensation returns. Do not wait until the pain is intolerable, as the medicine will be less effective. It is better to treat pain in advance than to try and catch  up.  °General Anesthesia:  °If you did not receive a nerve block during your surgery, you will need to start taking your pain medication shortly after your surgery and should continue to do so as prescribed by your surgeon.  °Pain Medication:  °Most commonly we prescribe Vicodin and Percocet for post-operative pain. Both of these medications contain a combination of acetaminophen (Tylenol®) and a narcotic to help control pain.  °· It takes between 30 and 45 minutes before pain medication starts to work. It is important to take your medication before your pain level gets too intense.  °· Nausea is a common side effect of many pain medications. You will want to eat something before taking your pain medicine to help prevent nausea.  °· If you are taking a prescription pain medication that contains acetaminophen, we recommend that you do not take additional over the counter acetaminophen (Tylenol®).  °Other pain relieving options:  °· Using a cold pack to ice the affected area a few times a day (15 to 20 minutes at a time) can help to relieve pain, reduce swelling and bruising.  °· Elevation of the affected area can also help to reduce pain and swelling. ° ° ° ° °Post Anesthesia Home Care Instructions ° °Activity: °Get plenty of rest for the remainder of the day. A responsible individual must stay with you for 24 hours following the procedure.  °For the next 24 hours, DO NOT: °-Drive a car °-Operate machinery °-Drink alcoholic beverages °-Take any medication unless instructed by your physician °-Make any legal decisions or sign important papers. ° °Meals: °Start with liquid foods such as gelatin   or soup. Progress to regular foods as tolerated. Avoid greasy, spicy, heavy foods. If nausea and/or vomiting occur, drink only clear liquids until the nausea and/or vomiting subsides. Call your physician if vomiting continues. ° °Special Instructions/Symptoms: °Your throat may feel dry or sore from the anesthesia or the  breathing tube placed in your throat during surgery. If this causes discomfort, gargle with warm salt water. The discomfort should disappear within 24 hours. ° °If you had a scopolamine patch placed behind your ear for the management of post- operative nausea and/or vomiting: ° °1. The medication in the patch is effective for 72 hours, after which it should be removed.  Wrap patch in a tissue and discard in the trash. Wash hands thoroughly with soap and water. °2. You may remove the patch earlier than 72 hours if you experience unpleasant side effects which may include dry mouth, dizziness or visual disturbances. °3. Avoid touching the patch. Wash your hands with soap and water after contact with the patch. °  ° °

## 2018-04-21 NOTE — Anesthesia Postprocedure Evaluation (Signed)
Anesthesia Post Note  Patient: John Vaughn  Procedure(s) Performed: LEFT CARPAL TUNNEL RELEASE (Left Hand)     Patient location during evaluation: PACU Anesthesia Type: Bier Block Level of consciousness: awake and alert, awake and oriented Pain management: pain level controlled Vital Signs Assessment: post-procedure vital signs reviewed and stable Respiratory status: spontaneous breathing, nonlabored ventilation and respiratory function stable Cardiovascular status: stable and blood pressure returned to baseline Postop Assessment: no apparent nausea or vomiting Anesthetic complications: no    Last Vitals:  Vitals:   04/21/18 1449 04/21/18 1530  BP: 116/81 124/60  Pulse: 69 69  Resp: 16 16  Temp: 36.5 C 36.7 C  SpO2: 100% 100%    Last Pain:  Vitals:   04/21/18 1530  TempSrc:   PainSc: 0-No pain                 Cecile Hearing

## 2018-04-21 NOTE — Transfer of Care (Signed)
Immediate Anesthesia Transfer of Care Note  Patient: John Vaughn  Procedure(s) Performed: LEFT CARPAL TUNNEL RELEASE (Left Hand)  Patient Location: PACU  Anesthesia Type:MAC and Bier block  Level of Consciousness: awake, oriented and sedated  Airway & Oxygen Therapy: Patient Spontanous Breathing  Post-op Assessment: Report given to RN and Post -op Vital signs reviewed and stable  Post vital signs: Reviewed and stable  Last Vitals:  Vitals Value Taken Time  BP    Temp    Pulse    Resp    SpO2      Last Pain:  Vitals:   04/21/18 1335  TempSrc: Oral  PainSc: 3       Patients Stated Pain Goal: 3 (60/45/40 9811)  Complications: No apparent anesthesia complications

## 2018-04-21 NOTE — Anesthesia Preprocedure Evaluation (Signed)
Anesthesia Evaluation  Patient identified by MRN, date of birth, ID band Patient awake    Reviewed: Allergy & Precautions, NPO status , Patient's Chart, lab work & pertinent test results  History of Anesthesia Complications Negative for: history of anesthetic complications  Airway Mallampati: II  TM Distance: >3 FB Neck ROM: Full    Dental  (+) Teeth Intact, Dental Advisory Given   Pulmonary neg pulmonary ROS,    Pulmonary exam normal breath sounds clear to auscultation       Cardiovascular Exercise Tolerance: Good negative cardio ROS Normal cardiovascular exam Rhythm:Regular Rate:Normal     Neuro/Psych negative neurological ROS     GI/Hepatic negative GI ROS, Neg liver ROS,   Endo/Other  Obesity   Renal/GU negative Renal ROS     Musculoskeletal  (+) Arthritis ,   Abdominal   Peds  Hematology negative hematology ROS (+)   Anesthesia Other Findings Day of surgery medications reviewed with the patient.  Reproductive/Obstetrics                             Anesthesia Physical Anesthesia Plan  ASA: II  Anesthesia Plan: Bier Block and Bier Block-LIDOCAINE ONLY   Post-op Pain Management:    Induction: Intravenous  PONV Risk Score and Plan: 1 and Propofol infusion  Airway Management Planned: Nasal Cannula and Natural Airway  Additional Equipment:   Intra-op Plan:   Post-operative Plan:   Informed Consent: I have reviewed the patients History and Physical, chart, labs and discussed the procedure including the risks, benefits and alternatives for the proposed anesthesia with the patient or authorized representative who has indicated his/her understanding and acceptance.   Dental advisory given  Plan Discussed with:   Anesthesia Plan Comments:         Anesthesia Quick Evaluation

## 2018-04-21 NOTE — Op Note (Signed)
   Carpal tunnel op note  DATE OF SURGERY:04/21/2018  PREOPERATIVE DIAGNOSIS:  Left Carpal tunnel syndrome  POSTOPERATIVE DIAGNOSIS: same  PROCEDURE:  Left carpal tunnel release. CPT 40981  SURGEON: Surgeon(s): Tarry Kos, MD  ASSIST: Oneal Grout, PA-C;  ANESTHESIA:  Regional  TOURNIQUET TIME: less than 10 minutes  BLOOD LOSS: Minimal.  COMPLICATIONS: None.  PATHOLOGY: None.  INDICATIONS: The patient is a 42 y.o. -year-old male who presented with carpal tunnel syndrome failing nonsurgical management, indicated for surgical release.  DESCRIPTION OF PROCEDURE: The patient was identified in the preoperative holding area.  The operative site was marked by the surgeon and confirmed by the patient.  He was brought back to the operating room.  Anesthesia was induced by the anesthesia team.  A well padded nonsterile tourniquet was placed. The operative extremity was prepped and draped in standard sterile fashion.  A timeout was performed.  Preoperative antibiotics were given.   A palmar incision was made about 5 mm ulnar to the thenar crease.  The palmar aponeurosis was exposed and divided in line with the skin incision. The palmaris brevis was visualized and divided.  The distal edge of the transcarpal ligament was identified. A hemostat was inserted into the carpal tunnel to protect the median nerve and the flexor tendons. Then, the transverse carpal ligament was released under direct visualization. Proximally, a subcutaneous tunnel was made allowing a Sewell retractor to be placed. Then, the distal portion of the antebrachial fascia was released. Distally, all fibrous bands were released. The median nerve was visualized, and the fat pad was exposed. Following release, local infiltration with 0.25% of Sensorcaine was given. The tourniquet was deflated. Hemostasis achieved.  Wound was irrigated and closed with 4-0 nylon sutures. Sterile dressing applied. The patient was  transferred to the recovery room in stable condition after all counts were correct.  POSTOPERATIVE PLAN: To start nerve gliding exercises as tolerated and no heavy lifting for four weeks.

## 2018-04-24 ENCOUNTER — Encounter (HOSPITAL_BASED_OUTPATIENT_CLINIC_OR_DEPARTMENT_OTHER): Payer: Self-pay | Admitting: Orthopaedic Surgery

## 2018-05-05 ENCOUNTER — Inpatient Hospital Stay (INDEPENDENT_AMBULATORY_CARE_PROVIDER_SITE_OTHER): Payer: 59 | Admitting: Physician Assistant

## 2018-05-09 ENCOUNTER — Encounter (INDEPENDENT_AMBULATORY_CARE_PROVIDER_SITE_OTHER): Payer: Self-pay | Admitting: Orthopaedic Surgery

## 2018-05-09 ENCOUNTER — Ambulatory Visit (INDEPENDENT_AMBULATORY_CARE_PROVIDER_SITE_OTHER): Payer: 59 | Admitting: Orthopaedic Surgery

## 2018-05-09 DIAGNOSIS — Z9889 Other specified postprocedural states: Secondary | ICD-10-CM

## 2018-05-09 NOTE — Progress Notes (Signed)
   Post-Op Visit Note   Patient: John Vaughn           Date of Birth: 1975-07-24           MRN: [id number] Visit Date: 05/09/2018 PCP: Lyndel Safe., MD   Assessment & Plan:  Chief Complaint:  Chief Complaint  Patient presents with  . Left Hand - Routine Post Op    Left Carpal Tunnel Release   Visit Diagnoses:  1. S/P carpal tunnel release     Plan: John Vaughn is 2-week status post left carpal tunnel release.  He is doing very well.  He states that he has had a significant improvement in his symptoms and is now able to sleep throughout the night.  His surgical incision has healed without signs of infection.  We removed the sutures today and placed mupirocin ointment and Steri-Strips.  I would like to recheck him in 4 weeks.  In the meantime he is to increase activity as tolerated to the left hand.  He did briefly mention what sounds like trigger fingers today.  We will watch this for now and see if this gets any worse.  I will recheck him in 4 weeks for his left carpal tunnel release.  Follow-Up Instructions: Return in about 4 weeks (around 06/06/2018).   Orders:  No orders of the defined types were placed in this encounter.  No orders of the defined types were placed in this encounter.   Imaging: No results found.  PMFS History: Patient Active Problem List   Diagnosis Date Noted  . S/P carpal tunnel release 03/17/2018  . Left carpal tunnel syndrome 03/17/2018  . Carpal tunnel syndrome on right   . Carpal tunnel syndrome, right upper limb 09/16/2017   Past Medical History:  Diagnosis Date  . Arthritis     No family history on file.  Past Surgical History:  Procedure Laterality Date  . CARPAL TUNNEL RELEASE Right 02/24/2018   Procedure: RIGHT CARPAL TUNNEL RELEASE;  Surgeon: Tarry Kos, MD;  Location: [facility];  Service: Orthopedics;  Laterality: Right;  . CARPAL TUNNEL RELEASE Left 04/21/2018   Procedure: LEFT CARPAL TUNNEL RELEASE;  Surgeon:  Tarry Kos, MD;  Location: [facility];  Service: Orthopedics;  Laterality: Left;  Bier block  . WISDOM TOOTH EXTRACTION     Social History   Occupational History  . Not on file  Tobacco Use  . Smoking status: Never Smoker  . Smokeless tobacco: Never Used  Substance and Sexual Activity  . Alcohol use: Yes    Comment: 1-2 drinks a week  . Drug use: Never  . Sexual activity: Not on file

## 2018-06-06 ENCOUNTER — Encounter (INDEPENDENT_AMBULATORY_CARE_PROVIDER_SITE_OTHER): Payer: Self-pay | Admitting: Orthopaedic Surgery

## 2018-06-06 ENCOUNTER — Ambulatory Visit (INDEPENDENT_AMBULATORY_CARE_PROVIDER_SITE_OTHER): Payer: 59 | Admitting: Orthopaedic Surgery

## 2018-06-06 DIAGNOSIS — G5602 Carpal tunnel syndrome, left upper limb: Secondary | ICD-10-CM

## 2018-06-06 DIAGNOSIS — M65342 Trigger finger, left ring finger: Secondary | ICD-10-CM | POA: Insufficient documentation

## 2018-06-06 DIAGNOSIS — M65331 Trigger finger, right middle finger: Secondary | ICD-10-CM | POA: Diagnosis not present

## 2018-06-06 DIAGNOSIS — Z9889 Other specified postprocedural states: Secondary | ICD-10-CM

## 2018-06-06 MED ORDER — METHYLPREDNISOLONE ACETATE 40 MG/ML IJ SUSP
13.3300 mg | INTRAMUSCULAR | Status: AC | PRN
  Administered 2018-06-06: 13.33 mg

## 2018-06-06 MED ORDER — BUPIVACAINE HCL 0.25 % IJ SOLN
0.3300 mL | INTRAMUSCULAR | Status: AC | PRN
  Administered 2018-06-06: .33 mL

## 2018-06-06 MED ORDER — LIDOCAINE HCL 1 % IJ SOLN
1.0000 mL | INTRAMUSCULAR | Status: AC | PRN
  Administered 2018-06-06: 1 mL

## 2018-06-06 NOTE — Progress Notes (Signed)
Office Visit Note   Patient: John CushingRandol B Calamia           Date of Birth: 05/26/1976           MRN: [id number] Visit Date: 06/06/2018              Requested by: Lyndel SafeGentry, Daniel E., MD No address on file PCP: Lyndel SafeGentry, Daniel E., MD   Assessment & Plan: Visit Diagnoses:  1. S/P carpal tunnel release   2. Left carpal tunnel syndrome   3. Trigger finger, left ring finger   4. Trigger finger, right middle finger     Plan: Impression is status post bilateral carpal tunnel syndrome doing well and new onset trigger finger right long and left ring fingers.  In regards to the carpal tunnel syndrome, he will advance with activity as tolerated.  In regards to the trigger fingers, we injected both of these with cortisone today.  He will follow-up with us as needed.  Follow-Up Instructions: Return if symptoms worsen or fail to improve.   Orders:  Orders Placed This Encounter  Procedures  . Hand/UE Inj: R long A1  . Hand/UE Inj: L ring A1   No orders of the defined types were placed in this encounter.     Procedures: Hand/UE Inj: R long A1 for trigger finger on 06/06/2018 8:39 AM Indications: pain Details: 25 G needle Medications: 1 mL lidocaine 1 %; 0.33 mL bupivacaine 0.25 %; 13.33 mg methylPREDNISolone acetate 40 MG/ML      Clinical Data: No additional findings.   Subjective: Chief Complaint  Patient presents with  . Left Wrist - Pain, Follow-up    HPI patient is a pleasant 42 year old gentleman who presents to our clinic today 46 days status post left carpal tunnel release, 04/21/2018.  Doing well with that.  He is also status post right carpal tunnel release from a few months back and is doing great there is well.  His new issue is triggering to the right middle and left ring fingers.  This began soon after surgical intervention to each respective hand.  The right does appear to be more symptomatic.  He has noticed some swelling and stiffness but more recently triggering worse  in the morning.  He has been taking over-the-counter anti-inflammatories with minimal relief of symptoms.  No previous injection to either trigger finger.  Review of Systems as detailed in HPI.  All others reviewed and are negative.   Objective: Vital Signs: There were no vitals taken for this visit.  Physical Exam well-developed and well-nourished gentleman in no acute distress.  Alert and oriented x3.  Ortho Exam examination of both upper extremities reveals a well-healing surgical incision from the carpal tunnel release surgeries.  No signs of infection.  Full sensation distally.  He does have noticeable triggering right long finger.  He does have minimal tenderness to the A1 pulley but really does not have a palpable nodule.  Minimal tenderness and no palpable nodule to the left ring finger A1 pulley.  No noticeable triggering.  Specialty Comments:  No specialty comments available.  Imaging: No new imaging   PMFS History: Patient Active Problem List   Diagnosis Date Noted  . Trigger finger, right middle finger 06/06/2018  . S/P carpal tunnel release 03/17/2018  . Left carpal tunnel syndrome 03/17/2018  . Carpal tunnel syndrome on right   . Carpal tunnel syndrome, right upper limb 09/16/2017   Past Medical History:  Diagnosis Date  . Arthritis  History reviewed. No pertinent family history.  Past Surgical History:  Procedure Laterality Date  . CARPAL TUNNEL RELEASE Right 02/24/2018   Procedure: RIGHT CARPAL TUNNEL RELEASE;  Surgeon: Tarry Kos, MD;  Location: [facility];  Service: Orthopedics;  Laterality: Right;  . CARPAL TUNNEL RELEASE Left 04/21/2018   Procedure: LEFT CARPAL TUNNEL RELEASE;  Surgeon: Tarry Kos, MD;  Location: [facility];  Service: Orthopedics;  Laterality: Left;  Bier block  . WISDOM TOOTH EXTRACTION     Social History   Occupational History  . Not on file  Tobacco Use  . Smoking status: Never Smoker  .  Smokeless tobacco: Never Used  Substance and Sexual Activity  . Alcohol use: Yes    Comment: 1-2 drinks a week  . Drug use: Never  . Sexual activity: Not on file

## 2018-06-06 NOTE — Progress Notes (Signed)
   Procedure Note  Patient: John CushingRandol B Vaughn             Date of Birth: 03/11/1976           MRN: [id number]             Visit Date: 06/06/2018  Procedures: Visit Diagnoses: S/P carpal tunnel release  Left carpal tunnel syndrome  Trigger finger, left ring finger - Plan: Hand/UE Inj: L ring A1  Trigger finger, right middle finger - Plan: Hand/UE Inj: R long A1  Hand/UE Inj: L ring A1 for trigger finger on 06/06/2018 8:40 AM Indications: pain Details: 25 G needle Medications: 1 mL lidocaine 1 %; 0.33 mL bupivacaine 0.25 %; 13.33 mg methylPREDNISolone acetate 40 MG/ML

## 2018-06-30 MED FILL — valACYclovir HCL 1 GM TABS: 1 | 15 days supply | Qty: 30 | Fill #0

## 2018-09-11 ENCOUNTER — Other Ambulatory Visit: Payer: Self-pay

## 2018-09-11 ENCOUNTER — Emergency Department (HOSPITAL_COMMUNITY)
Admission: EM | Admit: 2018-09-11 | Discharge: 2018-09-11 | Disposition: A | Discharge status: Home / Self Care | Payer: 59 | Attending: Emergency Medicine | Admitting: Emergency Medicine

## 2018-09-11 ENCOUNTER — Emergency Department (HOSPITAL_COMMUNITY): Payer: 59

## 2018-09-11 ENCOUNTER — Encounter (HOSPITAL_COMMUNITY): Payer: Self-pay | Admitting: Emergency Medicine

## 2018-09-11 DIAGNOSIS — T148XXA Other injury of unspecified body region, initial encounter: Secondary | ICD-10-CM

## 2018-09-11 DIAGNOSIS — Y9389 Activity, other specified: Secondary | ICD-10-CM | POA: Insufficient documentation

## 2018-09-11 DIAGNOSIS — Y92015 Private garage of single-family (private) house as the place of occurrence of the external cause: Secondary | ICD-10-CM | POA: Insufficient documentation

## 2018-09-11 DIAGNOSIS — Z79899 Other long term (current) drug therapy: Secondary | ICD-10-CM | POA: Diagnosis not present

## 2018-09-11 DIAGNOSIS — S61206A Unspecified open wound of right little finger without damage to nail, initial encounter: Secondary | ICD-10-CM | POA: Insufficient documentation

## 2018-09-11 DIAGNOSIS — Y998 Other external cause status: Secondary | ICD-10-CM | POA: Insufficient documentation

## 2018-09-11 DIAGNOSIS — S61204A Unspecified open wound of right ring finger without damage to nail, initial encounter: Secondary | ICD-10-CM | POA: Diagnosis not present

## 2018-09-11 DIAGNOSIS — S6991XA Unspecified injury of right wrist, hand and finger(s), initial encounter: Secondary | ICD-10-CM | POA: Diagnosis not present

## 2018-09-11 DIAGNOSIS — Z23 Encounter for immunization: Secondary | ICD-10-CM | POA: Diagnosis not present

## 2018-09-11 DIAGNOSIS — W231XXA Caught, crushed, jammed, or pinched between stationary objects, initial encounter: Secondary | ICD-10-CM | POA: Insufficient documentation

## 2018-09-11 MED ORDER — LIDOCAINE HCL 2 % IJ SOLN
10.0000 mL | Freq: Once | INTRAMUSCULAR | Status: AC
  Administered 2018-09-11: 200 mg via INTRADERMAL
  Filled 2018-09-11: qty 20

## 2018-09-11 MED ORDER — TRAMADOL HCL 50 MG PO TABS: 50.0000 mg | ORAL_TABLET | Freq: Four times a day (QID) | ORAL | 0 refills | Status: DC | PRN

## 2018-09-11 MED ORDER — CEPHALEXIN 250 MG PO CAPS
500.0000 mg | ORAL_CAPSULE | Freq: Once | ORAL | Status: AC
  Administered 2018-09-11: 500 mg via ORAL
  Filled 2018-09-11: qty 2

## 2018-09-11 MED ORDER — CEPHALEXIN 500 MG PO CAPS: 500.0000 mg | ORAL_CAPSULE | Freq: Two times a day (BID) | ORAL | 0 refills | Status: AC

## 2018-09-11 MED ORDER — TETANUS-DIPHTH-ACELL PERTUSSIS 5-2.5-18.5 LF-MCG/0.5 IM SUSP
0.5000 mL | Freq: Once | INTRAMUSCULAR | Status: AC
  Administered 2018-09-11: 0.5 mL via INTRAMUSCULAR
  Filled 2018-09-11: qty 0.5

## 2018-09-11 NOTE — Discharge Instructions (Addendum)
Evaluated today for avulsion of the skin.  We have placed Steri-Strips on this.  Please leave in place.  Follow-up with orthopedics within the next 3 days for reevaluation.  If area begins to bleed please hold pressure and elevate extremity.  Given tramadol for pain.  Please take as prescribed.  May also use Tylenol or ibuprofen if not want to use the tramadol.  Continue Keflex, antibiotics.  Please take as prescribed.

## 2018-09-11 NOTE — ED Provider Notes (Signed)
[facility] EMERGENCY DEPARTMENT Provider Note   CSN: [id number] Arrival date & time: 09/11/18  1620    History   Chief Complaint Chief Complaint  Patient presents with  . Finger Injury    HPI John Vaughn is a 43 y.o. male here with right fifth finger injury.  States that around 3 PM, he was trying to fix his garage door and his right fifth finger got caught in between a gear and the chain.  He states that the skin came off right away and he had some bleeding and pain. Tdap about 6 years ago.      The history is provided by the patient.    Past Medical History:  Diagnosis Date  . Arthritis     Patient Active Problem List   Diagnosis Date Noted  . Trigger finger, right middle finger 06/06/2018  . S/P carpal tunnel release 03/17/2018  . Left carpal tunnel syndrome 03/17/2018  . Carpal tunnel syndrome on right   . Carpal tunnel syndrome, right upper limb 09/16/2017    Past Surgical History:  Procedure Laterality Date  . CARPAL TUNNEL RELEASE Right 02/24/2018   Procedure: RIGHT CARPAL TUNNEL RELEASE;  Surgeon: Tarry Kos, MD;  Location: [facility];  Service: Orthopedics;  Laterality: Right;  . CARPAL TUNNEL RELEASE Left 04/21/2018   Procedure: LEFT CARPAL TUNNEL RELEASE;  Surgeon: Tarry Kos, MD;  Location: [facility];  Service: Orthopedics;  Laterality: Left;  Bier block  . WISDOM TOOTH EXTRACTION          Home Medications    Prior to Admission medications   Medication Sig Start Date End Date Taking? Authorizing Provider  atorvastatin (LIPITOR) 40 MG tablet TAKE 1 TABLET BY MOUTH AT BEDTIME 11/23/16   [provider]  HYDROcodone-acetaminophen (NORCO) 5-325 MG tablet Take 1-2 tablets by mouth 2 (two) times daily as needed. 04/21/18   Tarry Kos, MD  ibuprofen (ADVIL,MOTRIN) 200 MG tablet Take 200 mg by mouth every 6 (six) hours as needed.    [provider]  Multiple Vitamin (MULTIVITAMINS  PO) Take by mouth.    [provider]  ondansetron (ZOFRAN) 4 MG tablet Take 1-2 tablets (4-8 mg total) by mouth every 8 (eight) hours as needed for nausea or vomiting. 04/21/18   Tarry Kos, MD  OVER THE COUNTER MEDICATION Protein Shakes a couple times a week    [provider]  valACYclovir (VALTREX) 500 MG tablet Take by mouth.    [provider]  Vitamin D, Ergocalciferol, (DRISDOL) 50000 units CAPS capsule Take 50,000 Units by mouth every 7 (seven) days.    [provider]    Family History No family history on file.  Social History Social History   Tobacco Use  . Smoking status: Never Smoker  . Smokeless tobacco: Never Used  Substance Use Topics  . Alcohol use: Yes    Comment: 1-2 drinks a week  . Drug use: Never     Allergies   Patient has no known allergies.   Review of Systems Review of Systems  Skin: Positive for wound.  All other systems reviewed and are negative.    Physical Exam Updated Vital Signs BP (!) 159/106 (BP Location: Left Arm)   Pulse 79   Temp 98.6 F (37 C) (Oral)   Resp 18   Ht 5\' 11"  (1.803 m)   Wt 106.6 kg   SpO2 100%   BMI 32.78 kg/m  Physical Exam Vitals signs and nursing note reviewed.  HENT:     Head: Normocephalic.     Nose: Nose normal.     Mouth/Throat:     Mouth: Mucous membranes are moist.  Eyes:     Pupils: Pupils are equal, round, and reactive to light.  Neck:     Musculoskeletal: Normal range of motion.  Cardiovascular:     Rate and Rhythm: Normal rate.     Pulses: Normal pulses.     Heart sounds: Normal heart sounds.  Pulmonary:     Effort: Pulmonary effort is normal.  Abdominal:     General: Abdomen is flat.  Musculoskeletal:     Comments: R 5th finger with pulp missing. No obvious nail bed injury, able to flex and extend at the DIP joint. No obvious open fracture   Skin:    General: Skin is warm.     Capillary Refill: Capillary refill takes less than 2 seconds.    Neurological:     General: No focal deficit present.     Mental Status: He is alert.  Psychiatric:        Mood and Affect: Mood normal.        Behavior: Behavior normal.          ED Treatments / Results  Labs (all labs ordered are listed, but only abnormal results are displayed) Labs Reviewed - No data to display  EKG None  Radiology No results found.  Procedures Procedures (including critical care time)    Medications Ordered in ED Medications  lidocaine (XYLOCAINE) 2 % (with pres) injection 200 mg (200 mg Intradermal Given 09/11/18 1719)     Initial Impression / Assessment and Plan / ED Course  I have reviewed the triage vital signs and the nursing notes.  Pertinent labs & imaging results that were available during my care of the patient were reviewed by me and considered in my medical decision making (see chart for details).       John Vaughn is a 43 y.o. male here with R 5th finger injury. Consider tufts fracture vs skin avulsion. Will update tdap, give keflex, get xrays.   6:30 PM xrays still pending. Signed out to PA to follow up xrays. If he has a fracture, anticipate consult to hand surgery. If not, likely dc home with keflex, pain meds. He has followed up with Dr. Roda Shutters in the past.    Final Clinical Impressions(s) / ED Diagnoses   Final diagnoses:  None    ED Discharge Orders    None       Charlynne Pander, MD 09/11/18 [place]

## 2018-09-11 NOTE — ED Provider Notes (Addendum)
Care transferred from Dr. Silverio Lay, attending physician at shift transfer. See his note for full HPI.  In summation, 43 year old male here for evaluation of fifth finger injury.  Injury occurred at 3 PM.  Tetanus updated during his visit today.  Patient with dermal avulsion injury. No evidence of obvious open fracture or exposed bone on attendings exam. Tissue present overlapping distal phalanx. DDX: Tuft fracture vs skin avulsion.   If x-ray negative, plan for DC home with Keflex, cover with surgicel and follow-up with orthopedics.  Followed by Dr. Roda Shutters in past. If fracture plan for consult with hand surgery.  Physical Exam  BP (!) 145/104 (BP Location: Right Arm)   Pulse 82   Temp 98.6 F (37 C) (Oral)   Resp 16   Ht 5\' 11"  (1.803 m)   Wt 106.6 kg   SpO2 98%   BMI 32.78 kg/m   Physical Exam Vitals signs and nursing note reviewed.  Constitutional:      General: He is not in acute distress.    Appearance: He is well-developed. He is not ill-appearing, toxic-appearing or diaphoretic.  HENT:     Head: Atraumatic.     Nose: Nose normal.     Mouth/Throat:     Mouth: Mucous membranes are moist.     Pharynx: Oropharynx is clear.  Eyes:     Pupils: Pupils are equal, round, and reactive to light.  Neck:     Musculoskeletal: Normal range of motion and neck supple.  Cardiovascular:     Rate and Rhythm: Normal rate and regular rhythm.  Pulmonary:     Effort: Pulmonary effort is normal. No respiratory distress.  Abdominal:     General: There is no distension.     Palpations: Abdomen is soft.     Tenderness: There is no abdominal tenderness. There is no guarding or rebound.  Musculoskeletal: Normal range of motion.     Comments: Full range of motion with flexion extension to phalanges on right upper extremity.  Tenderness to palpation to distal fifth digit.  Skin:    General: Skin is warm and dry.     Comments: Prominent 1.5 cm x 1cm skin avulsion to distal fifth phalanx on right upper  extremity. Some pulp avulsed. Tissue is covering distal phalange.  No exposed bone. No nailbed involvement.  Bleeding controlled.  No evidence of foreign bodies.  Neurological:     Mental Status: He is alert.     Comments: Intact sensation to right upper extremity.  No weakness.    ED Course/Procedures     Procedures  MDM  43 year old male appears otherwise well presents for evaluation of finger injury.  Patient care transferred from attending physician, Dr. Silverio Lay at shift change pending x-ray to r/o underlying fracture. Patient with significant dermal avulsion, evaluated by attending physician. No evidence of exposed bone. Bleeding controlled. No evidence of vascular involvement. FROM without difficulty. Attending physician Dr. Silverio Lay has evaluated patient and recommends Surgicel as well as Xeroform to area, Keflex and close follow-up with orthopedics. He has seen Dr. Roda Shutters previously.  X-ray without evidence of fracture or dislocation. Patient with skin avulsion. No open fracture. Will DC home with Keflex, place Xeroform and Surgicel. Bleeding controlled, did require Quickclot and pressure for hemostasis. Hemodynamically stable and appropriate for DC home this time.  I have discussed return precautions with patient.  Patient to follow-up with orthopedics.  Patient voiced understanding of return precautions and is agreeable to follow-up.   Caylah Plouff A, PA-C 09/11/18  2326    John Vaughn A, PA-C 09/11/18 2345    Charlynne Pander, MD 09/12/18 1110

## 2018-09-11 NOTE — ED Triage Notes (Signed)
Pt. Stated, I got my pinky finger on rt. , it took the tip off of my finger. I got it caught in between a sprocket gear and a chain.

## 2018-09-12 MED FILL — traMADol HCL 50 MG TABS: 50 | 3 days supply | Qty: 10 | Fill #0

## 2018-09-12 MED FILL — CEPHALEXIN 500 MG CAPSULE: 500 | 3 days supply | Qty: 6 | Fill #0

## 2018-09-14 DIAGNOSIS — S67196A Crushing injury of right little finger, initial encounter: Secondary | ICD-10-CM | POA: Diagnosis not present

## 2018-09-14 DIAGNOSIS — M79644 Pain in right finger(s): Secondary | ICD-10-CM | POA: Diagnosis not present

## 2018-09-14 MED FILL — CEPHALEXIN 500 MG CAPSULE: 500 | 14 days supply | Qty: 56 | Fill #0

## 2018-09-15 MED FILL — traMADol HCL 50 MG TABS: 50 | 10 days supply | Qty: 40 | Fill #0

## 2018-09-20 MED FILL — valACYclovir HCL 1 GM TABS: 1 | 15 days supply | Qty: 30 | Fill #1

## 2018-09-21 DIAGNOSIS — S67196D Crushing injury of right little finger, subsequent encounter: Secondary | ICD-10-CM | POA: Diagnosis not present

## 2018-09-21 DIAGNOSIS — M79641 Pain in right hand: Secondary | ICD-10-CM | POA: Diagnosis not present

## 2018-09-25 ENCOUNTER — Telehealth (INDEPENDENT_AMBULATORY_CARE_PROVIDER_SITE_OTHER): Payer: Self-pay

## 2018-09-25 NOTE — Telephone Encounter (Signed)
Called patient and asked the screening questions.  Do you have now or have you had in the past 7 days a fever and/or chills? NO  Do you have now or have you had in the past 7 days a cough? NO  Do you have now or have you had in the last 7 days nausea, vomiting or abdominal pain? NO  Have you been exposed to anyone who has tested positive for COVID-19? NO  Have you or anyone who lives with you traveled within the last month? NO 

## 2018-09-26 ENCOUNTER — Encounter (INDEPENDENT_AMBULATORY_CARE_PROVIDER_SITE_OTHER): Payer: Self-pay | Admitting: Orthopaedic Surgery

## 2018-09-26 ENCOUNTER — Ambulatory Visit (INDEPENDENT_AMBULATORY_CARE_PROVIDER_SITE_OTHER): Payer: 59 | Admitting: Orthopaedic Surgery

## 2018-09-26 ENCOUNTER — Other Ambulatory Visit: Payer: Self-pay

## 2018-09-26 DIAGNOSIS — M79644 Pain in right finger(s): Secondary | ICD-10-CM | POA: Diagnosis not present

## 2018-09-26 DIAGNOSIS — M79645 Pain in left finger(s): Secondary | ICD-10-CM | POA: Diagnosis not present

## 2018-09-26 DIAGNOSIS — M65331 Trigger finger, right middle finger: Secondary | ICD-10-CM | POA: Diagnosis not present

## 2018-09-26 DIAGNOSIS — M65342 Trigger finger, left ring finger: Secondary | ICD-10-CM | POA: Diagnosis not present

## 2018-09-26 MED ORDER — BUPIVACAINE HCL 0.25 % IJ SOLN
0.3300 mL | INTRAMUSCULAR | Status: AC | PRN
  Administered 2018-09-26: .33 mL

## 2018-09-26 MED ORDER — METHYLPREDNISOLONE ACETATE 40 MG/ML IJ SUSP
13.3300 mg | INTRAMUSCULAR | Status: AC | PRN
  Administered 2018-09-26: 13.33 mg

## 2018-09-26 MED ORDER — LIDOCAINE HCL 1 % IJ SOLN
1.0000 mL | INTRAMUSCULAR | Status: AC | PRN
  Administered 2018-09-26: 1 mL

## 2018-09-26 NOTE — Progress Notes (Signed)
Office Visit Note   Patient: John Vaughn           Date of Birth: 04/04/1976           MRN: [id number] Visit Date: 09/26/2018              Requested by: Lyndel Safe., MD No address on file PCP: Lyndel Safe., MD   Assessment & Plan: Visit Diagnoses:  1. Trigger finger, right middle finger   2. Trigger finger, left ring finger     Plan: Impression is right long and left ring trigger fingers.  We injected both of these with cortisone today.  I did discuss surgical intervention should his symptoms return.  He will follow-up with [place] as needed.  Follow-Up Instructions: Return if symptoms worsen or fail to improve.   Orders:  Orders Placed This Encounter  Procedures   Hand/UE Inj: L ring A1   Hand/UE Inj: R long A1   No orders of the defined types were placed in this encounter.     Procedures: Hand/UE Inj: L ring A1 for trigger finger on 09/26/2018 3:53 PM Indications: pain Details: 25 G needle Medications: 0.33 mL bupivacaine 0.25 %; 1 mL lidocaine 1 %; 13.33 mg methylPREDNISolone acetate 40 MG/ML  Hand/UE Inj: R long A1 for trigger finger on 09/26/2018 4:06 PM Indications: pain Details: 25 G needle Medications: 0.33 mL bupivacaine 0.25 %; 1 mL lidocaine 1 %; 13.33 mg methylPREDNISolone acetate 40 MG/ML      Clinical Data: No additional findings.   Subjective: Chief Complaint  Patient presents with   Right Middle Finger - Pain   Left Ring Finger - Pain    HPI patient is a pleasant 43 year old gentleman presents our clinic today with recurrent trigger fingers.  Trigger fingers including the right long and left ring finger.  He was seen in our office on 06/06/2018 for this.  Both were injected.  Injection significantly helped for about 2 months.  His pain and triggering has since returned.  He does note that his triggering is worse in the morning and improves throughout the day with use of the hands.  He requests bilateral cortisone injections  today.  Review of Systems as detailed in HPI.  All others reviewed and are negative.   Objective: Vital Signs: There were no vitals taken for this visit.  Physical Exam well-developed and well-nourished gentleman in no acute distress.  Alert and oriented x3.  Ortho Exam examination of both hands reveals tenderness to the A1 pulley to the right long and left ring fingers.  No active triggering today.  He is neurovascular intact distally.  Specialty Comments:  No specialty comments available.  Imaging: No new imaging   PMFS History: Patient Active Problem List   Diagnosis Date Noted   Trigger finger, left ring finger 06/06/2018   S/P carpal tunnel release 03/17/2018   Left carpal tunnel syndrome 03/17/2018   Carpal tunnel syndrome on right    Carpal tunnel syndrome, right upper limb 09/16/2017   Past Medical History:  Diagnosis Date   Arthritis     History reviewed. No pertinent family history.  Past Surgical History:  Procedure Laterality Date   CARPAL TUNNEL RELEASE Right 02/24/2018   Procedure: RIGHT CARPAL TUNNEL RELEASE;  Surgeon: Tarry Kos, MD;  Location: [facility];  Service: Orthopedics;  Laterality: Right;   CARPAL TUNNEL RELEASE Left 04/21/2018   Procedure: LEFT CARPAL TUNNEL RELEASE;  Surgeon: Tarry Kos, MD;  Location: [facility];  Service: Orthopedics;  Laterality: Left;  Bier block   WISDOM TOOTH EXTRACTION     Social History   Occupational History   Not on file  Tobacco Use   Smoking status: Never Smoker   Smokeless tobacco: Never Used  Substance and Sexual Activity   Alcohol use: Yes    Comment: 1-2 drinks a week   Drug use: Never   Sexual activity: Not on file

## 2018-10-31 MED FILL — valACYclovir HCL 1 GM TABS: 1 | 15 days supply | Qty: 30 | Fill #0

## 2018-10-31 MED FILL — ATORVASTATIN 40 MG TABLET: 40 | 30 days supply | Qty: 30 | Fill #0

## 2018-11-13 DIAGNOSIS — E785 Hyperlipidemia, unspecified: Secondary | ICD-10-CM | POA: Diagnosis not present

## 2018-11-13 DIAGNOSIS — E782 Mixed hyperlipidemia: Secondary | ICD-10-CM | POA: Diagnosis not present

## 2018-11-13 DIAGNOSIS — Z0000 Encounter for general adult medical examination without abnormal findings: Secondary | ICD-10-CM | POA: Diagnosis not present

## 2018-11-24 MED FILL — ATORVASTATIN 40 MG TABLET: 40 | 30 days supply | Qty: 30 | Fill #0

## 2018-11-29 DIAGNOSIS — Z0000 Encounter for general adult medical examination without abnormal findings: Secondary | ICD-10-CM | POA: Diagnosis not present

## 2019-02-17 DIAGNOSIS — Z1159 Encounter for screening for other viral diseases: Secondary | ICD-10-CM | POA: Diagnosis not present

## 2019-02-17 DIAGNOSIS — Z20828 Contact with and (suspected) exposure to other viral communicable diseases: Secondary | ICD-10-CM | POA: Diagnosis not present

## 2019-02-17 DIAGNOSIS — R05 Cough: Secondary | ICD-10-CM | POA: Diagnosis not present

## 2019-02-17 DIAGNOSIS — R51 Headache: Secondary | ICD-10-CM | POA: Diagnosis not present

## 2019-02-17 DIAGNOSIS — R0981 Nasal congestion: Secondary | ICD-10-CM | POA: Diagnosis not present

## 2019-03-28 ENCOUNTER — Encounter: Payer: Self-pay | Admitting: Orthopaedic Surgery

## 2019-03-28 ENCOUNTER — Ambulatory Visit (INDEPENDENT_AMBULATORY_CARE_PROVIDER_SITE_OTHER): Payer: 59 | Admitting: Orthopaedic Surgery

## 2019-03-28 VITALS — Ht 71.0 in | Wt 235.0 lb

## 2019-03-28 DIAGNOSIS — M65342 Trigger finger, left ring finger: Secondary | ICD-10-CM

## 2019-03-28 DIAGNOSIS — M65331 Trigger finger, right middle finger: Secondary | ICD-10-CM

## 2019-03-28 MED ORDER — METHYLPREDNISOLONE ACETATE 40 MG/ML IJ SUSP
13.3300 mg | INTRAMUSCULAR | Status: AC | PRN
  Administered 2019-03-28: 13.33 mg

## 2019-03-28 MED ORDER — BUPIVACAINE HCL 0.5 % IJ SOLN
0.3300 mL | INTRAMUSCULAR | Status: AC | PRN
  Administered 2019-03-28: .33 mL

## 2019-03-28 MED ORDER — LIDOCAINE HCL 1 % IJ SOLN
0.3000 mL | INTRAMUSCULAR | Status: AC | PRN
  Administered 2019-03-28: .3 mL

## 2019-03-28 NOTE — Progress Notes (Signed)
Office Visit Note   Patient: John Vaughn           Date of Birth: [birth year]           MRN: [id number] Visit Date: 03/28/2019              Requested by: Lyndel Safe., MD [address] [place],  [place] [address] PCP: Lyndel Safe., MD   Assessment & Plan: Visit Diagnoses: No diagnosis found.  Plan: Impression is left ring and right middle trigger fingers.  Both were injected today.  He will likely be in touch with [place] about having surgery for these in a staged fashion sometime next year.  Questions encouraged and answered.  Details of the surgery including risks, complications, alternatives were discussed today.  Follow-Up Instructions: Return if symptoms worsen or fail to improve.   Orders:  No orders of the defined types were placed in this encounter.  No orders of the defined types were placed in this encounter.     Procedures: Hand/UE Inj: L ring A1 for trigger finger on 03/28/2019 5:08 PM Indications: pain Details: 25 G needle Medications: 0.3 mL lidocaine 1 %; 0.33 mL bupivacaine 0.5 %; 13.33 mg methylPREDNISolone acetate 40 MG/ML Outcome: tolerated well, no immediate complications Consent was given by the patient. Patient was prepped and draped in the usual sterile fashion.   Hand/UE Inj: R long A1 for trigger finger on 03/28/2019 5:08 PM Indications: pain Details: 25 G needle Medications: 0.3 mL lidocaine 1 %; 0.33 mL bupivacaine 0.5 %; 13.33 mg methylPREDNISolone acetate 40 MG/ML Outcome: tolerated well, no immediate complications Consent was given by the patient. Patient was prepped and draped in the usual sterile fashion.       Clinical Data: No additional findings.   Subjective: Chief Complaint  Patient presents with  . Right Hand - Follow-up, Pain    Middle finger; Cortisone  . Left Hand - Follow-up, Pain    Ring finger pain; prefer Cortisone    John Vaughn is a 43 year old who comes in for evaluation of recurrent left ring and  right middle trigger finger.  He has had 2 injections in each fingers within the last year or so.  He is gotten about 3 months of relief from each injection.  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  Denies any numbness and tingling.  He is doing well from his carpal tunnel surgeries.   Review of Systems   Objective: Vital Signs: Ht 5\' 11"  (1.803 m)   Wt 235 lb (106.6 kg)   BMI 32.78 kg/m   Physical Exam  Ortho Exam Bilateral hand exams are unchanged. Specialty Comments:  No specialty comments available.  Imaging: No results found.   PMFS History: Patient Active Problem List   Diagnosis Date Noted  . Trigger finger, left ring finger 06/06/2018  . S/P carpal tunnel release 03/17/2018  . Left carpal tunnel syndrome 03/17/2018  . Carpal tunnel syndrome on right   . Carpal tunnel syndrome, right upper limb 09/16/2017   Past Medical History:  Diagnosis Date  . Arthritis     No family history on file.  Past Surgical History:  Procedure Laterality Date  . CARPAL TUNNEL RELEASE Right 02/24/2018   Procedure: RIGHT CARPAL TUNNEL RELEASE;  Surgeon: 02/26/2018, MD;  Location: [facility];  Service: Orthopedics;  Laterality: Right;  . CARPAL TUNNEL RELEASE Left 04/21/2018   Procedure: LEFT CARPAL  TUNNEL RELEASE;  Surgeon: Leandrew Koyanagi, MD;  Location: [place];  Service: Orthopedics;  Laterality: Left;  Bier block  . WISDOM TOOTH EXTRACTION     Social History   Occupational History  . Not on file  Tobacco Use  . Smoking status: Never Smoker  . Smokeless tobacco: Never Used  Substance and Sexual Activity  . Alcohol use: Yes    Comment: 1-2 drinks a week  . Drug use: Never  . Sexual activity: Not on file

## 2019-05-07 MED FILL — ATORVASTATIN 40 MG TABLET: 40 | 30 days supply | Qty: 30 | Fill #1

## 2019-05-29 DIAGNOSIS — R7301 Impaired fasting glucose: Secondary | ICD-10-CM | POA: Diagnosis not present

## 2019-06-04 DIAGNOSIS — E782 Mixed hyperlipidemia: Secondary | ICD-10-CM | POA: Diagnosis not present

## 2019-06-04 DIAGNOSIS — R7301 Impaired fasting glucose: Secondary | ICD-10-CM | POA: Diagnosis not present

## 2019-06-04 MED FILL — ATORVASTATIN 40 MG TABLET: 40 | 90 days supply | Qty: 90 | Fill #0

## 2019-06-04 MED FILL — valACYclovir HCL 1 GM TABS: 1 | 15 days supply | Qty: 30 | Fill #0

## 2019-06-07 DIAGNOSIS — Z23 Encounter for immunization: Secondary | ICD-10-CM | POA: Diagnosis not present

## 2019-06-12 IMAGING — DX RIGHT LITTLE FINGER 2+V
3 series · 3 of 3 positions shown · non-contrast
Comparison: None.

CLINICAL DATA: Soft tissue injury to distal fifth finger.

EXAM:
RIGHT LITTLE FINGER 2+V

[x finger pa right]
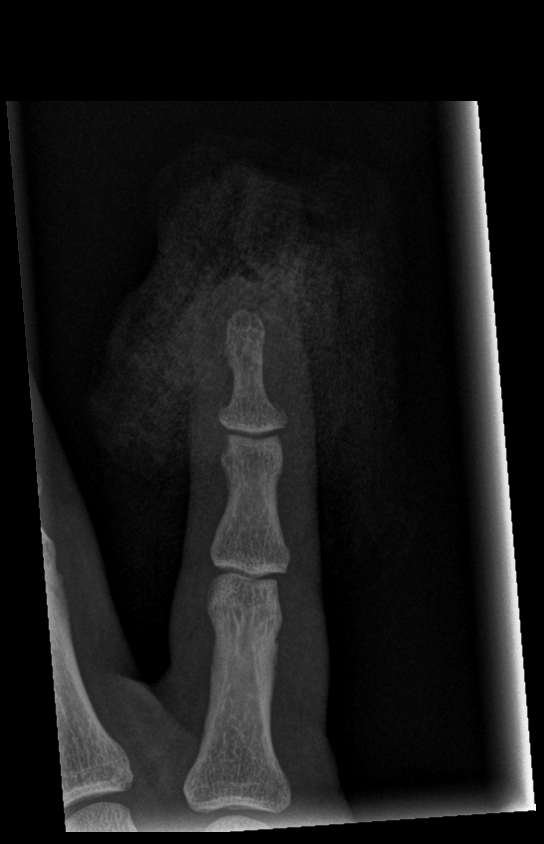

[x finger obl right]
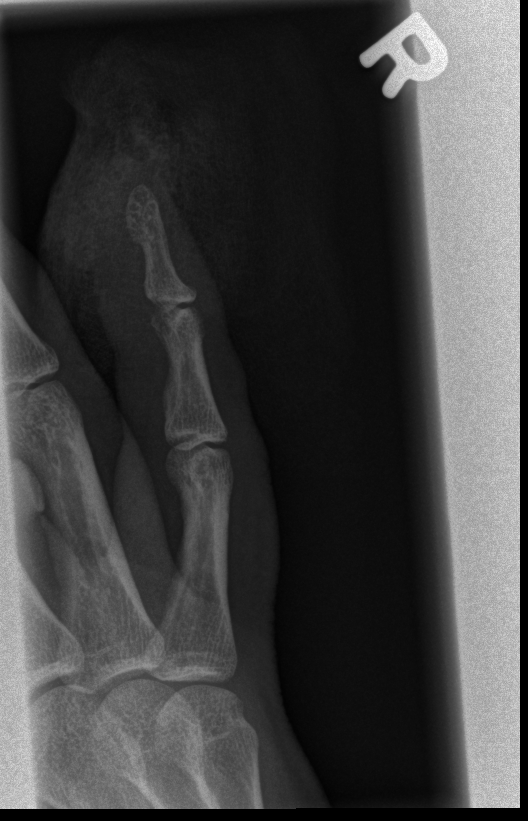

[x finger lat right]
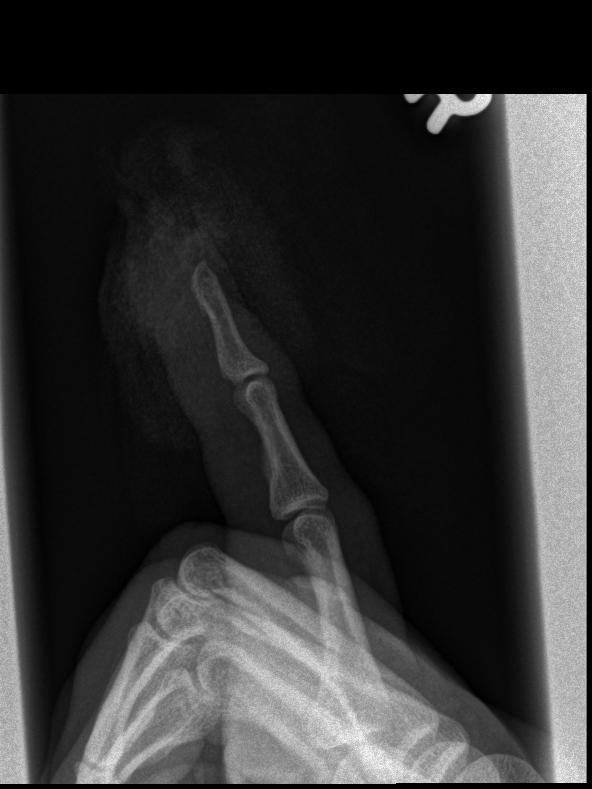

[3 of 3 positions shown; findings below may reference images not displayed]

FINDINGS: Evaluation is limited due to overlapping dressings. No foreign
bodies identified. No fractures are seen.
IMPRESSION: No foreign bodies or fractures identified.

## 2019-06-22 ENCOUNTER — Other Ambulatory Visit: Payer: Self-pay

## 2019-06-22 ENCOUNTER — Encounter (HOSPITAL_BASED_OUTPATIENT_CLINIC_OR_DEPARTMENT_OTHER): Payer: Self-pay | Admitting: Orthopaedic Surgery

## 2019-06-22 ENCOUNTER — Ambulatory Visit: Payer: 59 | Admitting: Orthopaedic Surgery

## 2019-06-22 ENCOUNTER — Other Ambulatory Visit (HOSPITAL_COMMUNITY)
Admission: RE | Admit: 2019-06-22 | Discharge: 2019-06-22 | Disposition: A | Discharge status: Home / Self Care | Payer: 59 | Source: Ambulatory Visit | Attending: Orthopaedic Surgery | Admitting: Orthopaedic Surgery

## 2019-06-22 DIAGNOSIS — Z01812 Encounter for preprocedural laboratory examination: Secondary | ICD-10-CM | POA: Insufficient documentation

## 2019-06-22 DIAGNOSIS — Z20828 Contact with and (suspected) exposure to other viral communicable diseases: Secondary | ICD-10-CM | POA: Insufficient documentation

## 2019-06-22 LAB — SARS CORONAVIRUS 2 (TAT 6-24 HRS): SARS Coronavirus 2: NEGATIVE

## 2019-06-22 NOTE — Discharge Instructions (Signed)
   Postoperative instructions:  Weightbearing instructions: no lifting more than 10 lbs.  Dressing instructions: Keep your dressing and/or splint clean and dry at all times.  It will be removed at your first post-operative appointment.  Your stitches and/or staples will be removed at this visit.  Incision instructions:  Do not soak your incision for 3 weeks after surgery.  If the incision gets wet, pat dry and do not scrub the incision.  Pain control:  You have been given a prescription to be taken as directed for post-operative pain control.  In addition, elevate the operative extremity above the heart at all times to prevent swelling and throbbing pain.  Take over-the-counter Colace, 100mg by mouth twice a day while taking narcotic pain medications to help prevent constipation.  Follow up appointments: 1) 7 days for wound check. 2) John Vaughn as scheduled.   -------------------------------------------------------------------------------------------------------------  After Surgery Pain Control:  After your surgery, post-surgical discomfort or pain is likely. This discomfort can last several days to a few weeks. At certain times of the day your discomfort may be more intense.  Did you receive a nerve block?  A nerve block can provide pain relief for one hour to two days after your surgery. As long as the nerve block is working, you will experience little or no sensation in the area the surgeon operated on.  As the nerve block wears off, you will begin to experience pain or discomfort. It is very important that you begin taking your prescribed pain medication before the nerve block fully wears off. Treating your pain at the first sign of the block wearing off will ensure your pain is better controlled and more tolerable when full-sensation returns. Do not wait until the pain is intolerable, as the medicine will be less effective. It is better to treat pain in advance than to try and catch up.    General Anesthesia:  If you did not receive a nerve block during your surgery, you will need to start taking your pain medication shortly after your surgery and should continue to do so as prescribed by your surgeon.  Pain Medication:  Most commonly we prescribe Vicodin and Percocet for post-operative pain. Both of these medications contain a combination of acetaminophen (Tylenol) and a narcotic to help control pain.   It takes between 30 and 45 minutes before pain medication starts to work. It is important to take your medication before your pain level gets too intense.   Nausea is a common side effect of many pain medications. You will want to eat something before taking your pain medicine to help prevent nausea.   If you are taking a prescription pain medication that contains acetaminophen, we recommend that you do not take additional over the counter acetaminophen (Tylenol).  Other pain relieving options:   Using a cold pack to ice the affected area a few times a day (15 to 20 minutes at a time) can help to relieve pain, reduce swelling and bruising.   Elevation of the affected area can also help to reduce pain and swelling.  

## 2019-06-25 ENCOUNTER — Encounter (HOSPITAL_BASED_OUTPATIENT_CLINIC_OR_DEPARTMENT_OTHER)
Admission: RE | Disposition: A | Discharge status: Home / Self Care | Payer: Self-pay | Source: Home / Self Care | Attending: Orthopaedic Surgery

## 2019-06-25 ENCOUNTER — Ambulatory Visit (HOSPITAL_BASED_OUTPATIENT_CLINIC_OR_DEPARTMENT_OTHER): Payer: 59 | Admitting: Anesthesiology

## 2019-06-25 ENCOUNTER — Ambulatory Visit (HOSPITAL_BASED_OUTPATIENT_CLINIC_OR_DEPARTMENT_OTHER)
Admission: RE | Admit: 2019-06-25 | Discharge: 2019-06-25 | Disposition: A | Discharge status: Home / Self Care | Payer: 59 | Attending: Orthopaedic Surgery | Admitting: Orthopaedic Surgery

## 2019-06-25 ENCOUNTER — Encounter (HOSPITAL_BASED_OUTPATIENT_CLINIC_OR_DEPARTMENT_OTHER): Payer: Self-pay | Admitting: Orthopaedic Surgery

## 2019-06-25 ENCOUNTER — Other Ambulatory Visit: Payer: Self-pay

## 2019-06-25 DIAGNOSIS — Z79899 Other long term (current) drug therapy: Secondary | ICD-10-CM | POA: Insufficient documentation

## 2019-06-25 DIAGNOSIS — M1990 Unspecified osteoarthritis, unspecified site: Secondary | ICD-10-CM | POA: Diagnosis not present

## 2019-06-25 DIAGNOSIS — E669 Obesity, unspecified: Secondary | ICD-10-CM | POA: Diagnosis not present

## 2019-06-25 DIAGNOSIS — Z6832 Body mass index (BMI) 32.0-32.9, adult: Secondary | ICD-10-CM | POA: Insufficient documentation

## 2019-06-25 DIAGNOSIS — Z885 Allergy status to narcotic agent status: Secondary | ICD-10-CM | POA: Insufficient documentation

## 2019-06-25 DIAGNOSIS — Z791 Long term (current) use of non-steroidal anti-inflammatories (NSAID): Secondary | ICD-10-CM | POA: Insufficient documentation

## 2019-06-25 DIAGNOSIS — M65342 Trigger finger, left ring finger: Secondary | ICD-10-CM | POA: Diagnosis present

## 2019-06-25 HISTORY — PX: TRIGGER FINGER RELEASE: SHX641

## 2019-06-25 SURGERY — RELEASE, A1 PULLEY, FOR TRIGGER FINGER
Anesthesia: Monitor Anesthesia Care | Site: Hand | Laterality: Left

## 2019-06-25 MED ORDER — CHLORHEXIDINE GLUCONATE 4 % EX LIQD: 60.0000 mL | Freq: Once | CUTANEOUS | Status: DC

## 2019-06-25 MED ORDER — PROPOFOL 10 MG/ML IV BOLUS
INTRAVENOUS | Status: DC | PRN
  Administered 2019-06-25: 40 mg via INTRAVENOUS
  Administered 2019-06-25: 20 mg via INTRAVENOUS
  Administered 2019-06-25: 100 ug/kg/min via INTRAVENOUS
  Administered 2019-06-25: 10 mg via INTRAVENOUS

## 2019-06-25 MED ORDER — MIDAZOLAM HCL 2 MG/2ML IJ SOLN
INTRAMUSCULAR | Status: DC | PRN
  Administered 2019-06-25: 2 mg via INTRAVENOUS

## 2019-06-25 MED ORDER — ONDANSETRON HCL 4 MG/2ML IJ SOLN
INTRAMUSCULAR | Status: AC
  Filled 2019-06-25: qty 2

## 2019-06-25 MED ORDER — MIDAZOLAM HCL 2 MG/2ML IJ SOLN
INTRAMUSCULAR | Status: AC
  Filled 2019-06-25: qty 2

## 2019-06-25 MED ORDER — LACTATED RINGERS IV SOLN: INTRAVENOUS | Status: DC

## 2019-06-25 MED ORDER — FENTANYL CITRATE (PF) 100 MCG/2ML IJ SOLN
INTRAMUSCULAR | Status: AC
  Filled 2019-06-25: qty 2

## 2019-06-25 MED ORDER — PROMETHAZINE HCL 25 MG/ML IJ SOLN: 6.2500 mg | INTRAMUSCULAR | Status: DC | PRN

## 2019-06-25 MED ORDER — BUPIVACAINE HCL (PF) 0.25 % IJ SOLN
INTRAMUSCULAR | Status: AC
  Filled 2019-06-25: qty 30

## 2019-06-25 MED ORDER — CEFAZOLIN SODIUM-DEXTROSE 2-4 GM/100ML-% IV SOLN
2.0000 g | INTRAVENOUS | Status: AC
  Administered 2019-06-25: 14:00:00 2 g via INTRAVENOUS

## 2019-06-25 MED ORDER — CELECOXIB 400 MG PO CAPS
400.0000 mg | ORAL_CAPSULE | Freq: Once | ORAL | Status: AC
  Administered 2019-06-25: 400 mg via ORAL

## 2019-06-25 MED ORDER — CEFAZOLIN SODIUM-DEXTROSE 2-4 GM/100ML-% IV SOLN
INTRAVENOUS | Status: AC
  Filled 2019-06-25: qty 100

## 2019-06-25 MED ORDER — ACETAMINOPHEN 500 MG PO TABS
ORAL_TABLET | ORAL | Status: AC
  Filled 2019-06-25: qty 2

## 2019-06-25 MED ORDER — LIDOCAINE HCL (PF) 0.5 % IJ SOLN
INTRAMUSCULAR | Status: DC | PRN
  Administered 2019-06-25: 35 mL via INTRAVENOUS

## 2019-06-25 MED ORDER — BUPIVACAINE HCL (PF) 0.25 % IJ SOLN
INTRAMUSCULAR | Status: DC | PRN
  Administered 2019-06-25: 10 mL

## 2019-06-25 MED ORDER — FENTANYL CITRATE (PF) 100 MCG/2ML IJ SOLN
INTRAMUSCULAR | Status: DC | PRN
  Administered 2019-06-25 (×2): 50 ug via INTRAVENOUS

## 2019-06-25 MED ORDER — PROPOFOL 10 MG/ML IV BOLUS
INTRAVENOUS | Status: AC
  Filled 2019-06-25: qty 20

## 2019-06-25 MED ORDER — CELECOXIB 200 MG PO CAPS
ORAL_CAPSULE | ORAL | Status: AC
  Filled 2019-06-25: qty 2

## 2019-06-25 MED ORDER — FENTANYL CITRATE (PF) 100 MCG/2ML IJ SOLN: 25.0000 ug | INTRAMUSCULAR | Status: DC | PRN

## 2019-06-25 MED ORDER — ONDANSETRON HCL 4 MG/2ML IJ SOLN
INTRAMUSCULAR | Status: DC | PRN
  Administered 2019-06-25: 4 mg via INTRAVENOUS

## 2019-06-25 MED ORDER — LIDOCAINE HCL (CARDIAC) PF 100 MG/5ML IV SOSY
PREFILLED_SYRINGE | INTRAVENOUS | Status: DC | PRN
  Administered 2019-06-25: 40 mg via INTRAVENOUS

## 2019-06-25 MED ORDER — LIDOCAINE 2% (20 MG/ML) 5 ML SYRINGE
INTRAMUSCULAR | Status: AC
  Filled 2019-06-25: qty 5

## 2019-06-25 MED ORDER — ACETAMINOPHEN 500 MG PO TABS
1000.0000 mg | ORAL_TABLET | Freq: Once | ORAL | Status: AC
  Administered 2019-06-25: 1000 mg via ORAL

## 2019-06-25 SURGICAL SUPPLY — 43 items
BAND RUBBER #18 3X1/16 STRL (MISCELLANEOUS) ×6 IMPLANT
BLADE SURG 15 STRL LF DISP TIS (BLADE) ×1 IMPLANT
BLADE SURG 15 STRL SS (BLADE) ×2
BNDG ELASTIC 3X5.8 VLCR STR LF (GAUZE/BANDAGES/DRESSINGS) ×3 IMPLANT
BNDG ESMARK 4X9 LF (GAUZE/BANDAGES/DRESSINGS) ×3 IMPLANT
BRUSH SCRUB EZ PLAIN DRY (MISCELLANEOUS) ×3 IMPLANT
CANISTER SUCT 1200ML W/VALVE (MISCELLANEOUS) ×3 IMPLANT
CORD BIPOLAR FORCEPS 12FT (ELECTRODE) ×3 IMPLANT
COVER BACK TABLE REUSABLE LG (DRAPES) ×3 IMPLANT
COVER MAYO STAND REUSABLE (DRAPES) ×3 IMPLANT
COVER WAND RF STERILE (DRAPES) IMPLANT
CUFF TOURN SGL QUICK 18X4 (TOURNIQUET CUFF) ×3 IMPLANT
DECANTER SPIKE VIAL GLASS SM (MISCELLANEOUS) IMPLANT
DRAPE EXTREMITY T 121X128X90 (DISPOSABLE) ×3 IMPLANT
DRAPE SURG 17X23 STRL (DRAPES) ×3 IMPLANT
GAUZE SPONGE 4X4 12PLY STRL (GAUZE/BANDAGES/DRESSINGS) ×3 IMPLANT
GAUZE XEROFORM 1X8 LF (GAUZE/BANDAGES/DRESSINGS) ×3 IMPLANT
GLOVE BIOGEL PI IND STRL 7.0 (GLOVE) ×2 IMPLANT
GLOVE BIOGEL PI IND STRL 7.5 (GLOVE) ×1 IMPLANT
GLOVE BIOGEL PI INDICATOR 7.0 (GLOVE) ×4
GLOVE BIOGEL PI INDICATOR 7.5 (GLOVE) ×2
GLOVE ECLIPSE 7.0 STRL STRAW (GLOVE) ×6 IMPLANT
GLOVE SKINSENSE NS SZ7.5 (GLOVE) ×2
GLOVE SKINSENSE STRL SZ7.5 (GLOVE) ×1 IMPLANT
GLOVE SURG SYN 7.5  E (GLOVE) ×2
GLOVE SURG SYN 7.5 E (GLOVE) ×1 IMPLANT
GOWN STRL REIN XL XLG (GOWN DISPOSABLE) ×3 IMPLANT
GOWN STRL REUS W/ TWL XL LVL3 (GOWN DISPOSABLE) ×1 IMPLANT
GOWN STRL REUS W/TWL XL LVL3 (GOWN DISPOSABLE) ×2
NEEDLE HYPO 25X1 1.5 SAFETY (NEEDLE) ×3 IMPLANT
NS IRRIG 1000ML POUR BTL (IV SOLUTION) ×3 IMPLANT
PACK BASIN DAY SURGERY FS (CUSTOM PROCEDURE TRAY) ×3 IMPLANT
PAD CAST 3X4 CTTN HI CHSV (CAST SUPPLIES) ×1 IMPLANT
PADDING CAST COTTON 3X4 STRL (CAST SUPPLIES) ×2
STOCKINETTE 4X48 STRL (DRAPES) ×3 IMPLANT
SUT ETHILON 4 0 PS 2 18 (SUTURE) ×3 IMPLANT
SYR BULB 3OZ (MISCELLANEOUS) ×3 IMPLANT
SYR CONTROL 10ML LL (SYRINGE) ×3 IMPLANT
TOWEL GREEN STERILE FF (TOWEL DISPOSABLE) ×3 IMPLANT
TRAY DSU PREP LF (CUSTOM PROCEDURE TRAY) ×3 IMPLANT
TUBE CONNECTING 20'X1/4 (TUBING) ×1
TUBE CONNECTING 20X1/4 (TUBING) ×2 IMPLANT
UNDERPAD 30X36 HEAVY ABSORB (UNDERPADS AND DIAPERS) ×3 IMPLANT

## 2019-06-25 NOTE — Anesthesia Preprocedure Evaluation (Addendum)
Anesthesia Evaluation  Patient identified by MRN, date of birth, ID band Patient awake    Reviewed: Allergy & Precautions, NPO status , Patient's Chart, lab work & pertinent test results  History of Anesthesia Complications Negative for: history of anesthetic complications  Airway Mallampati: II  TM Distance: >3 FB Neck ROM: Full    Dental no notable dental hx. (+) Dental Advisory Given   Pulmonary neg pulmonary ROS,    Pulmonary exam normal        Cardiovascular Exercise Tolerance: Good negative cardio ROS Normal cardiovascular exam     Neuro/Psych negative neurological ROS     GI/Hepatic negative GI ROS, Neg liver ROS,   Endo/Other  Obesity   Renal/GU negative Renal ROS     Musculoskeletal  (+) Arthritis ,   Abdominal   Peds  Hematology negative hematology ROS (+)   Anesthesia Other Findings Day of surgery medications reviewed with the patient.  Reproductive/Obstetrics                            Anesthesia Physical  Anesthesia Plan  ASA: II  Anesthesia Plan: Bier Block and Bier Block-LIDOCAINE ONLY   Post-op Pain Management:    Induction: Intravenous  PONV Risk Score and Plan: 1 and Propofol infusion  Airway Management Planned: Nasal Cannula and Natural Airway  Additional Equipment:   Intra-op Plan:   Post-operative Plan:   Informed Consent: I have reviewed the patients History and Physical, chart, labs and discussed the procedure including the risks, benefits and alternatives for the proposed anesthesia with the patient or authorized representative who has indicated his/her understanding and acceptance.     Dental advisory given  Plan Discussed with: Anesthesiologist, CRNA and Surgeon  Anesthesia Plan Comments:        Anesthesia Quick Evaluation

## 2019-06-25 NOTE — Anesthesia Postprocedure Evaluation (Signed)
Anesthesia Post Note  Patient: Ruthine Dose  Procedure(s) Performed: LEFT RING FINGER TRIGGER RELEASE (Left Hand)     Patient location during evaluation: PACU Anesthesia Type: MAC and Bier Block Level of consciousness: awake and alert Pain management: pain level controlled Vital Signs Assessment: post-procedure vital signs reviewed and stable Respiratory status: spontaneous breathing, nonlabored ventilation and respiratory function stable Cardiovascular status: stable and blood pressure returned to baseline Postop Assessment: no apparent nausea or vomiting Anesthetic complications: no    Last Vitals:  Vitals:   06/25/19 1415 06/25/19 1425  BP: 108/69 118/80  Pulse: 72   Resp: 16 16  Temp:  36.5 C  SpO2: 97% 97%    Last Pain:  Vitals:   06/25/19 1425  TempSrc:   PainSc: 0-No pain                 Charnae Lill A.

## 2019-06-25 NOTE — H&P (Signed)
PREOPERATIVE H&P  Chief Complaint: left ring finger trigger finger  HPI: John Vaughn is a 43 y.o. male who presents for surgical treatment of left ring finger trigger finger.  He denies any changes in medical history.  Past Medical History:  Diagnosis Date  . Arthritis    Past Surgical History:  Procedure Laterality Date  . CARPAL TUNNEL RELEASE Right 02/24/2018   Procedure: RIGHT CARPAL TUNNEL RELEASE;  Surgeon: Tarry Kos, MD;  Location: [facility];  Service: Orthopedics;  Laterality: Right;  . CARPAL TUNNEL RELEASE Left 04/21/2018   Procedure: LEFT CARPAL TUNNEL RELEASE;  Surgeon: Tarry Kos, MD;  Location: [facility];  Service: Orthopedics;  Laterality: Left;  Bier block  . WISDOM TOOTH EXTRACTION     Social History   Socioeconomic History  . Marital status: Married    Spouse name: Not on file  . Number of children: Not on file  . Years of education: Not on file  . Highest education level: Not on file  Occupational History  . Not on file  Tobacco Use  . Smoking status: Never Smoker  . Smokeless tobacco: Never Used  Substance and Sexual Activity  . Alcohol use: Yes    Comment: 1-2 drinks a week  . Drug use: Never  . Sexual activity: Not on file  Other Topics Concern  . Not on file  Social History Narrative  . Not on file   Social Determinants of Health   Financial Resource Strain:   . Difficulty of Paying Living Expenses: Not on file  Food Insecurity:   . Worried About Programme researcher, broadcasting/film/video in the Last Year: Not on file  . Ran Out of Food in the Last Year: Not on file  Transportation Needs:   . Lack of Transportation (Medical): Not on file  . Lack of Transportation (Non-Medical): Not on file  Physical Activity:   . Days of Exercise per Week: Not on file  . Minutes of Exercise per Session: Not on file  Stress:   . Feeling of Stress : Not on file  Social Connections:   . Frequency of Communication with Friends and  Family: Not on file  . Frequency of Social Gatherings with Friends and Family: Not on file  . Attends Religious Services: Not on file  . Active Member of Clubs or Organizations: Not on file  . Attends Banker Meetings: Not on file  . Marital Status: Not on file   History reviewed. No pertinent family history. Allergies  Allergen Reactions  . Hydrocodone Other (See Comments)    Causes hiccups   Prior to Admission medications   Medication Sig Start Date End Date Taking? Authorizing Provider  atorvastatin (LIPITOR) 40 MG tablet Take 40 mg by mouth 2 (two) times a week. Monday and friday 11/23/16  Yes [provider]  Multiple Vitamin (MULTIVITAMINS PO) Take 1 tablet by mouth daily with supper.    Yes [provider]  naproxen (NAPROSYN) 250 MG tablet Take 500 mg by mouth every 12 (twelve) hours.   Yes [provider]  valACYclovir (VALTREX) 500 MG tablet Take 500 mg by mouth daily as needed (fever blister).     [provider]     Positive ROS: All other systems have been reviewed and were otherwise negative with the exception of those mentioned in the HPI and as above.  Physical Exam: General: Alert, no acute distress Cardiovascular: No pedal edema Respiratory: No  cyanosis, no use of accessory musculature GI: abdomen soft Skin: No lesions in the area of chief complaint Neurologic: Sensation intact distally Psychiatric: Patient is competent for consent with normal mood and affect Lymphatic: no lymphedema  MUSCULOSKELETAL: exam stable  Assessment: left ring finger trigger finger  Plan: Plan for Procedure(s): LEFT RING FINGER TRIGGER RELEASE  The risks benefits and alternatives were discussed with the patient including but not limited to the risks of nonoperative treatment, versus surgical intervention including infection, bleeding, nerve injury,  blood clots, cardiopulmonary complications, morbidity, mortality, among others, and  they were willing to proceed.   Eduard Roux, MD   06/25/2019 7:19 AM

## 2019-06-25 NOTE — Transfer of Care (Signed)
Immediate Anesthesia Transfer of Care Note  Patient: John Vaughn  Procedure(s) Performed: LEFT RING FINGER TRIGGER RELEASE (Left Hand)  Patient Location: PACU  Anesthesia Type:Bier block  Level of Consciousness: awake, alert , oriented and patient cooperative  Airway & Oxygen Therapy: Patient Spontanous Breathing  Post-op Assessment: Report given to RN and Post -op Vital signs reviewed and stable  Post vital signs: Reviewed and stable  Last Vitals:  Vitals Value Taken Time  BP    Temp    Pulse 72 06/25/19 1358  Resp 14 06/25/19 1358  SpO2 100 % 06/25/19 1358  Vitals shown include unvalidated device data.  Last Pain:  Vitals:   06/25/19 1245  TempSrc: Tympanic  PainSc: 0-No pain         Complications: No apparent anesthesia complications

## 2019-06-25 NOTE — Op Note (Signed)
   Date of Surgery: 06/25/2019  INDICATIONS: Mr. Swingle is a 43 y.o.-year-old male who presents for surgical treatment of a left ring trigger finger;  The patient did consent to the procedure after discussion of the risks and benefits.  PREOPERATIVE DIAGNOSIS: left ring trigger finger  POSTOPERATIVE DIAGNOSIS: Same.  PROCEDURE: Incision of A1 pulley for trigger finger release, left ring finger  SURGEON: N. Eduard Roux, M.D.  ASSIST: Ciro Backer Hugo, [place]; necessary for the timely completion of procedure  ANESTHESIA:  Bier block  IV FLUIDS AND URINE: See anesthesia.  ESTIMATED BLOOD LOSS: minimal mL  COMPLICATIONS: None.  DESCRIPTION OF PROCEDURE: The patient was identified in the preoperative holding area. The operative site was marked by the surgeon confirmed with the patient. He is brought back to the operating room. She was placed supine on table. A nonsterile tourniquet was placed on the upper forearm. The extremity was exsanguinated using Esmarch bandage and the tourniquet was inflated to 250 mmHg. The Bier block was administered. The operative extremity was prepped and draped in standard sterile fashion. Timeout was performed. Antibiotics were given. Timeout was performed. A horizontal incision based in the palmar crease in line with the left ring finger was used. Blunt dissection was taken down to the level of the flexor tendon. The neurovascular bundles were identified on each side of the tendon sheath and protected. The proximal edge of the A1 pulley was identified. This was sharply incised. Of note the tendon was of good quality and did not exhibit any tears. The A1 pulley was incised along its full width. Care was taken not to violate the A2 pulley. The palmar pulley was then visualized and released also. The tourniquet was then deflated and hemostasis was obtained. Local anesthesia was infiltrated. The wound was thoroughly irrigated and closed with 3-0 nylon sutures. Sterile  dressings were applied and the hand was placed in a soft dressing. Patient tolerated the procedure well and was taken to the PACU in stable condition.  POSTOPERATIVE PLAN: Patient will be weight bearing as tolerated and to avoid heavy lifting for 4 weeks.    Azucena Cecil, MD [place] 1:44 PM

## 2019-07-04 ENCOUNTER — Encounter: Payer: Self-pay | Admitting: Physician Assistant

## 2019-07-04 ENCOUNTER — Other Ambulatory Visit: Payer: Self-pay

## 2019-07-04 ENCOUNTER — Ambulatory Visit (INDEPENDENT_AMBULATORY_CARE_PROVIDER_SITE_OTHER): Payer: 59 | Admitting: Physician Assistant

## 2019-07-04 DIAGNOSIS — Z9889 Other specified postprocedural states: Secondary | ICD-10-CM

## 2019-07-04 NOTE — Progress Notes (Signed)
   Post-Op Visit Note   Patient: John Vaughn           Date of Birth: 11-21-1975           MRN: [id number] Visit Date: 07/04/2019 PCP: Lemar Livings., MD   Assessment & Plan:  Chief Complaint: No chief complaint on file.  Visit Diagnoses:  1. S/P trigger finger release     Plan: Patient is a pleasant 44 year old gentleman who presents our clinic today 1 week status post left ring finger trigger finger release, date of surgery 06/25/2019.  He has been doing well.  He does note that he removed his bandage this past Monday but has been covering his wound with Coban.  He has returned to work on light duty without any issues.  No fevers or chills.  Examination of his left hand reveals a well-healing surgical incision with nylon sutures in place.  No evidence of infection or cellulitis.  He is neurovascular intact distally.  Today, the wound was cleaned and dry dressing reapplied.  Ace wrap applied.  He will follow-up next week for suture removal.  He has been instructed of no heavy lifting or submerging his hand in water for a total of 4 weeks postop.  Call with concerns or questions in meantime.  Follow-Up Instructions: Return in about 1 week (around 07/11/2019).   Orders:  No orders of the defined types were placed in this encounter.  No orders of the defined types were placed in this encounter.   Imaging: No new imaging  PMFS History: Patient Active Problem List   Diagnosis Date Noted  . Trigger finger, left ring finger 06/06/2018  . S/P carpal tunnel release 03/17/2018  . Left carpal tunnel syndrome 03/17/2018  . Carpal tunnel syndrome on right   . Carpal tunnel syndrome, right upper limb 09/16/2017   Past Medical History:  Diagnosis Date  . Arthritis     History reviewed. No pertinent family history.  Past Surgical History:  Procedure Laterality Date  . CARPAL TUNNEL RELEASE Right 02/24/2018   Procedure: RIGHT CARPAL TUNNEL RELEASE;  Surgeon: Leandrew Koyanagi, MD;   Location: [facility];  Service: Orthopedics;  Laterality: Right;  . CARPAL TUNNEL RELEASE Left 04/21/2018   Procedure: LEFT CARPAL TUNNEL RELEASE;  Surgeon: Leandrew Koyanagi, MD;  Location: [facility];  Service: Orthopedics;  Laterality: Left;  Bier block  . TRIGGER FINGER RELEASE Left 06/25/2019   Procedure: LEFT RING FINGER TRIGGER RELEASE;  Surgeon: Leandrew Koyanagi, MD;  Location: [facility];  Service: Orthopedics;  Laterality: Left;  . WISDOM TOOTH EXTRACTION     Social History   Occupational History  . Not on file  Tobacco Use  . Smoking status: Never Smoker  . Smokeless tobacco: Never Used  Substance and Sexual Activity  . Alcohol use: Yes    Comment: 1-2 drinks a week  . Drug use: Never  . Sexual activity: Not on file

## 2019-07-10 ENCOUNTER — Other Ambulatory Visit: Payer: Self-pay

## 2019-07-10 ENCOUNTER — Encounter: Payer: Self-pay | Admitting: Orthopaedic Surgery

## 2019-07-10 ENCOUNTER — Ambulatory Visit: Payer: 59 | Admitting: Orthopaedic Surgery

## 2019-07-10 DIAGNOSIS — M65342 Trigger finger, left ring finger: Secondary | ICD-10-CM

## 2019-07-10 DIAGNOSIS — Z9889 Other specified postprocedural states: Secondary | ICD-10-CM | POA: Insufficient documentation

## 2019-07-10 MED ORDER — MUPIROCIN 2 % EX OINT: 1.0000 "application " | TOPICAL_OINTMENT | Freq: Two times a day (BID) | CUTANEOUS | 0 refills | Status: AC

## 2019-07-10 MED FILL — valACYclovir HCL 1 GM TABS: 1 | 15 days supply | Qty: 30 | Fill #0

## 2019-07-10 MED FILL — MUPIROCIN 2% OINTMENT: 2 | 14 days supply | Qty: 22 | Fill #0

## 2019-07-10 MED FILL — ATORVASTATIN 40 MG TABLET: 40 | 90 days supply | Qty: 90 | Fill #0

## 2019-07-10 NOTE — Progress Notes (Signed)
   Post-Op Visit Note   Patient: John Vaughn           Date of Birth: 1975-07-12           MRN: [id number] Visit Date: 07/10/2019 PCP: Lyndel Safe., MD   Assessment & Plan:  Chief Complaint:  Chief Complaint  Patient presents with  . Left Ring Finger - Routine Post Op   Visit Diagnoses:  1. S/P trigger finger release   2. Trigger finger, left ring finger     Plan: John Vaughn is 2 weeks s/p trigger finger release.  He is doing well. Denies any signs or symptoms of infection.  Incision is healed.  Range of motion of the finger is almost near normal.  We remove the sutures today and placed mupirocin ointment as well as a Band-Aid.  He will do this twice a day.  Restrictions were again reviewed with the patient.  I would like to recheck him in about 4 weeks.  Questions encouraged and answered.  Follow-Up Instructions: Return in about 4 weeks (around 08/07/2019).   Orders:  No orders of the defined types were placed in this encounter.  Meds ordered this encounter  Medications  . mupirocin ointment (BACTROBAN) 2 %    Sig: Apply 1 application topically 2 (two) times daily.    Dispense:  22 g    Refill:  0    Imaging: No results found.  PMFS History: Patient Active Problem List   Diagnosis Date Noted  . S/P trigger finger release 07/10/2019  . Trigger finger, left ring finger 06/06/2018  . S/P carpal tunnel release 03/17/2018  . Left carpal tunnel syndrome 03/17/2018  . Carpal tunnel syndrome on right   . Carpal tunnel syndrome, right upper limb 09/16/2017   Past Medical History:  Diagnosis Date  . Arthritis     History reviewed. No pertinent family history.  Past Surgical History:  Procedure Laterality Date  . CARPAL TUNNEL RELEASE Right 02/24/2018   Procedure: RIGHT CARPAL TUNNEL RELEASE;  Surgeon: Tarry Kos, MD;  Location: [facility];  Service: Orthopedics;  Laterality: Right;  . CARPAL TUNNEL RELEASE Left 04/21/2018   Procedure: LEFT CARPAL  TUNNEL RELEASE;  Surgeon: Tarry Kos, MD;  Location: [facility];  Service: Orthopedics;  Laterality: Left;  Bier block  . TRIGGER FINGER RELEASE Left 06/25/2019   Procedure: LEFT RING FINGER TRIGGER RELEASE;  Surgeon: Tarry Kos, MD;  Location: [facility];  Service: Orthopedics;  Laterality: Left;  . WISDOM TOOTH EXTRACTION     Social History   Occupational History  . Not on file  Tobacco Use  . Smoking status: Never Smoker  . Smokeless tobacco: Never Used  Substance and Sexual Activity  . Alcohol use: Yes    Comment: 1-2 drinks a week  . Drug use: Never  . Sexual activity: Not on file

## 2019-07-20 MED FILL — MUPIROCIN 2% OINTMENT: 2 | 14 days supply | Qty: 22 | Fill #0

## 2019-08-07 ENCOUNTER — Encounter: Payer: Self-pay | Admitting: Orthopaedic Surgery

## 2019-08-07 ENCOUNTER — Other Ambulatory Visit: Payer: Self-pay

## 2019-08-07 ENCOUNTER — Ambulatory Visit (INDEPENDENT_AMBULATORY_CARE_PROVIDER_SITE_OTHER): Payer: 59 | Admitting: Physician Assistant

## 2019-08-07 DIAGNOSIS — Z9889 Other specified postprocedural states: Secondary | ICD-10-CM

## 2019-08-07 NOTE — Progress Notes (Signed)
   Post-Op Visit Note   Patient: John Vaughn           Date of Birth: August 24, 1975           MRN: [id number] Visit Date: 08/07/2019 PCP: Lyndel Safe., MD   Assessment & Plan:  Chief Complaint: No chief complaint on file.  Visit Diagnoses:  1. S/P trigger finger release     Plan: Patient is a pleasant 44 year old gentleman who comes in today 6 weeks out left long trigger finger release, date of surgery 06/25/2019.  He has been doing well.  He does complain of slight discomfort at the end of the day if he is having to use power tools and constant gripping for a long period of time.  Examination of his left hand reveals a fully healed surgical scar without complication.  He has full range of motion strength.  He is neurovascular intact distally.  At this point, he will continue to advance with activity as tolerated.  No restrictions.  Follow-up with [place] as needed.  Call with concerns or questions.  Follow-Up Instructions: Return if symptoms worsen or fail to improve.   Orders:  No orders of the defined types were placed in this encounter.  No orders of the defined types were placed in this encounter.   Imaging: No new imaging  PMFS History: Patient Active Problem List   Diagnosis Date Noted  . S/P trigger finger release 07/10/2019  . Trigger finger, left ring finger 06/06/2018  . S/P carpal tunnel release 03/17/2018  . Left carpal tunnel syndrome 03/17/2018  . Carpal tunnel syndrome on right   . Carpal tunnel syndrome, right upper limb 09/16/2017   Past Medical History:  Diagnosis Date  . Arthritis     History reviewed. No pertinent family history.  Past Surgical History:  Procedure Laterality Date  . CARPAL TUNNEL RELEASE Right 02/24/2018   Procedure: RIGHT CARPAL TUNNEL RELEASE;  Surgeon: Tarry Kos, MD;  Location: [facility];  Service: Orthopedics;  Laterality: Right;  . CARPAL TUNNEL RELEASE Left 04/21/2018   Procedure: LEFT CARPAL TUNNEL  RELEASE;  Surgeon: Tarry Kos, MD;  Location: [facility];  Service: Orthopedics;  Laterality: Left;  Bier block  . TRIGGER FINGER RELEASE Left 06/25/2019   Procedure: LEFT RING FINGER TRIGGER RELEASE;  Surgeon: Tarry Kos, MD;  Location: [facility];  Service: Orthopedics;  Laterality: Left;  . WISDOM TOOTH EXTRACTION     Social History   Occupational History  . Not on file  Tobacco Use  . Smoking status: Never Smoker  . Smokeless tobacco: Never Used  Substance and Sexual Activity  . Alcohol use: Yes    Comment: 1-2 drinks a week  . Drug use: Never  . Sexual activity: Not on file

## 2019-09-28 ENCOUNTER — Ambulatory Visit: Payer: 59 | Attending: Internal Medicine

## 2019-09-28 DIAGNOSIS — Z23 Encounter for immunization: Secondary | ICD-10-CM

## 2019-09-28 NOTE — Progress Notes (Signed)
   Covid-19 Vaccination Clinic  Name:  SAGAN MASELLI    MRN: [id number] DOB: [birth year]  09/28/2019  Mr. Loomer was observed post Covid-19 immunization for 15 minutes without incident. He was provided with Vaccine Information Sheet and instruction to access the V-Safe system.   Mr. Persing was instructed to call 911 with any severe reactions post vaccine: Marland Kitchen Difficulty breathing  . Swelling of face and throat  . A fast heartbeat  . A bad rash all over body  . Dizziness and weakness   Immunizations Administered    Name Date Dose VIS Date Route   Pfizer COVID-19 Vaccine 09/28/2019  3:38 PM 0.3 mL 06/15/2019 Intramuscular   Manufacturer: [employer], [employer]   Lot: [id number]   NDC: [address]-5

## 2019-10-23 ENCOUNTER — Ambulatory Visit: Payer: 59 | Attending: Internal Medicine

## 2019-10-23 DIAGNOSIS — Z23 Encounter for immunization: Secondary | ICD-10-CM

## 2019-10-23 NOTE — Progress Notes (Signed)
   Covid-19 Vaccination Clinic  Name:  JERRIK HOUSHOLDER    MRN: [id number] DOB: 1976/07/03  10/23/2019  Mr. Jacquin was observed post Covid-19 immunization for 15 minutes without incident. He was provided with Vaccine Information Sheet and instruction to access the V-Safe system.   Mr. Stamos was instructed to call 911 with any severe reactions post vaccine: Marland Kitchen Difficulty breathing  . Swelling of face and throat  . A fast heartbeat  . A bad rash all over body  . Dizziness and weakness   Immunizations Administered    Name Date Dose VIS Date Route   Pfizer COVID-19 Vaccine 10/23/2019  4:30 PM 0.3 mL 08/29/2018 Intramuscular   Manufacturer: [employer], [employer]   Lot: [id number]   NDC: [address]-9

## 2019-11-28 DIAGNOSIS — Z0000 Encounter for general adult medical examination without abnormal findings: Secondary | ICD-10-CM | POA: Diagnosis not present

## 2019-11-28 DIAGNOSIS — R7301 Impaired fasting glucose: Secondary | ICD-10-CM | POA: Diagnosis not present

## 2019-11-28 DIAGNOSIS — E782 Mixed hyperlipidemia: Secondary | ICD-10-CM | POA: Diagnosis not present

## 2019-12-05 ENCOUNTER — Other Ambulatory Visit (HOSPITAL_COMMUNITY): Payer: Self-pay | Admitting: Family Medicine

## 2019-12-05 DIAGNOSIS — Z0000 Encounter for general adult medical examination without abnormal findings: Secondary | ICD-10-CM | POA: Diagnosis not present

## 2019-12-24 MED FILL — ATORVASTATIN CALCIUM 40 MG: 40 | 90 days supply | Qty: 90 | Fill #1

## 2020-03-19 MED FILL — ATORVASTATIN CALCIUM 40 MG: 40 | 90 days supply | Qty: 90 | Fill #2

## 2020-04-08 ENCOUNTER — Ambulatory Visit: Payer: 59 | Admitting: Orthopaedic Surgery

## 2020-04-08 ENCOUNTER — Encounter: Payer: Self-pay | Admitting: Orthopaedic Surgery

## 2020-04-08 DIAGNOSIS — M65331 Trigger finger, right middle finger: Secondary | ICD-10-CM

## 2020-04-08 MED ORDER — BUPIVACAINE HCL 0.25 % IJ SOLN
0.3300 mL | INTRAMUSCULAR | Status: AC | PRN
  Administered 2020-04-08: .33 mL

## 2020-04-08 MED ORDER — LIDOCAINE HCL 1 % IJ SOLN
1.0000 mL | INTRAMUSCULAR | Status: AC | PRN
  Administered 2020-04-08: 1 mL

## 2020-04-08 MED ORDER — METHYLPREDNISOLONE ACETATE 40 MG/ML IJ SUSP
13.3300 mg | INTRAMUSCULAR | Status: AC | PRN
  Administered 2020-04-08: 13.33 mg

## 2020-04-08 NOTE — Progress Notes (Signed)
Office Visit Note   Patient: John Vaughn           Date of Birth: 16-Jul-1975           MRN: [id number] Visit Date: 04/08/2020              Requested by: Lyndel Safe., MD No address on file PCP: Lyndel Safe., MD   Assessment & Plan: Visit Diagnoses:  1. Trigger finger, right middle finger     Plan: Impression is recurrent right long trigger finger.  We re injected this with cortisone today.  Should his symptoms return, he will follow-up for further discussion of surgical intervention.  He will call with concerns or questions otherwise.  Follow-Up Instructions: Return if symptoms worsen or fail to improve.   Orders:  Orders Placed This Encounter  Procedures  . Hand/UE Inj: R long A1   No orders of the defined types were placed in this encounter.     Procedures: Hand/UE Inj: R long A1 for trigger finger on 04/08/2020 3:34 PM Indications: pain Details: 25 G needle Medications: 1 mL lidocaine 1 %; 0.33 mL bupivacaine 0.25 %; 13.33 mg methylPREDNISolone acetate 40 MG/ML      Clinical Data: No additional findings.   Subjective: Chief Complaint  Patient presents with  . Right Middle Finger - Pain    HPI patient is a pleasant 44 year old gentleman who comes in today with recurrent triggering to the right long finger.  We saw him about a year ago for this where it was injected.  Injection helped until about 2 months ago and his pain and triggering returned.  He comes in today requesting a repeat injection.  Of note, he is not a diabetic.  Review of Systems as detailed in HPI.  All others reviewed and are negative.   Objective: Vital Signs: There were no vitals taken for this visit.  Physical Exam well-developed well-nourished gentleman in no acute distress.  Alert oriented x3.  Ortho Exam examination of the right long finger shows a tender and palpable nodule at the A1 pulley.  He does have reproducible triggering.  Specialty Comments:  No specialty  comments available.  Imaging:  No new imaging   PMFS History: Patient Active Problem List   Diagnosis Date Noted  . S/P trigger finger release 07/10/2019  . Trigger finger, left ring finger 06/06/2018  . S/P carpal tunnel release 03/17/2018  . Left carpal tunnel syndrome 03/17/2018  . Carpal tunnel syndrome on right   . Carpal tunnel syndrome, right upper limb 09/16/2017   Past Medical History:  Diagnosis Date  . Arthritis     History reviewed. No pertinent family history.  Past Surgical History:  Procedure Laterality Date  . CARPAL TUNNEL RELEASE Right 02/24/2018   Procedure: RIGHT CARPAL TUNNEL RELEASE;  Surgeon: Tarry Kos, MD;  Location: [facility];  Service: Orthopedics;  Laterality: Right;  . CARPAL TUNNEL RELEASE Left 04/21/2018   Procedure: LEFT CARPAL TUNNEL RELEASE;  Surgeon: Tarry Kos, MD;  Location: [facility];  Service: Orthopedics;  Laterality: Left;  Bier block  . TRIGGER FINGER RELEASE Left 06/25/2019   Procedure: LEFT RING FINGER TRIGGER RELEASE;  Surgeon: Tarry Kos, MD;  Location: [facility];  Service: Orthopedics;  Laterality: Left;  . WISDOM TOOTH EXTRACTION     Social History   Occupational History  . Not on file  Tobacco Use  . Smoking status: Never Smoker  . Smokeless  tobacco: Never Used  Vaping Use  . Vaping Use: Never used  Substance and Sexual Activity  . Alcohol use: Yes    Comment: 1-2 drinks a week  . Drug use: Never  . Sexual activity: Not on file

## 2020-05-27 MED FILL — valACYclovir HCL 1 GM TABS: 1 | 15 days supply | Qty: 30 | Fill #1

## 2020-06-16 MED FILL — ATORVASTATIN 40 MG TABLET: 40 | 90 days supply | Qty: 90 | Fill #0

## 2020-07-25 DIAGNOSIS — E782 Mixed hyperlipidemia: Secondary | ICD-10-CM | POA: Diagnosis not present

## 2020-07-25 DIAGNOSIS — R7301 Impaired fasting glucose: Secondary | ICD-10-CM | POA: Diagnosis not present

## 2020-07-31 ENCOUNTER — Other Ambulatory Visit (HOSPITAL_COMMUNITY): Payer: Self-pay | Admitting: Family Medicine

## 2020-07-31 DIAGNOSIS — B009 Herpesviral infection, unspecified: Secondary | ICD-10-CM | POA: Diagnosis not present

## 2020-07-31 DIAGNOSIS — E782 Mixed hyperlipidemia: Secondary | ICD-10-CM | POA: Diagnosis not present

## 2020-07-31 DIAGNOSIS — R7301 Impaired fasting glucose: Secondary | ICD-10-CM | POA: Diagnosis not present

## 2020-07-31 DIAGNOSIS — E559 Vitamin D deficiency, unspecified: Secondary | ICD-10-CM | POA: Diagnosis not present

## 2020-07-31 DIAGNOSIS — Z23 Encounter for immunization: Secondary | ICD-10-CM | POA: Diagnosis not present

## 2020-09-22 ENCOUNTER — Other Ambulatory Visit (HOSPITAL_BASED_OUTPATIENT_CLINIC_OR_DEPARTMENT_OTHER): Payer: Self-pay

## 2020-10-09 ENCOUNTER — Other Ambulatory Visit (HOSPITAL_COMMUNITY): Payer: Self-pay

## 2020-10-09 MED FILL — Atorvastatin Calcium Tab 40 MG (Base Equivalent): ORAL | 90 days supply | Qty: 90 | Fill #0 | Status: AC

## 2020-10-15 ENCOUNTER — Other Ambulatory Visit (HOSPITAL_COMMUNITY): Payer: Self-pay

## 2021-01-22 ENCOUNTER — Other Ambulatory Visit (HOSPITAL_COMMUNITY): Payer: Self-pay

## 2021-01-22 MED FILL — Atorvastatin Calcium Tab 40 MG (Base Equivalent): ORAL | 90 days supply | Qty: 90 | Fill #0 | Status: AC

## 2021-03-04 DIAGNOSIS — Z79899 Other long term (current) drug therapy: Secondary | ICD-10-CM | POA: Diagnosis not present

## 2021-03-04 DIAGNOSIS — E782 Mixed hyperlipidemia: Secondary | ICD-10-CM | POA: Diagnosis not present

## 2021-03-04 DIAGNOSIS — Z0001 Encounter for general adult medical examination with abnormal findings: Secondary | ICD-10-CM | POA: Diagnosis not present

## 2021-03-04 DIAGNOSIS — R7301 Impaired fasting glucose: Secondary | ICD-10-CM | POA: Diagnosis not present

## 2021-03-10 ENCOUNTER — Other Ambulatory Visit (HOSPITAL_COMMUNITY): Payer: Self-pay

## 2021-03-10 DIAGNOSIS — Z23 Encounter for immunization: Secondary | ICD-10-CM | POA: Diagnosis not present

## 2021-03-10 DIAGNOSIS — Z0000 Encounter for general adult medical examination without abnormal findings: Secondary | ICD-10-CM | POA: Diagnosis not present

## 2021-03-10 MED ORDER — VALACYCLOVIR HCL 1 G PO TABS
2000.0000 mg | ORAL_TABLET | Freq: Every day | ORAL | 11 refills | Status: AC
  Filled 2021-03-10: qty 30, 15d supply, fill #0
  Filled 2021-10-16: qty 30, 15d supply, fill #1
  Filled 2021-12-09: qty 30, 15d supply, fill #2

## 2021-03-10 MED ORDER — ATORVASTATIN CALCIUM 40 MG PO TABS
40.0000 mg | ORAL_TABLET | Freq: Every day | ORAL | 3 refills | Status: DC
  Filled 2021-03-10 – 2021-10-16 (×2): qty 90, 90d supply, fill #0

## 2021-03-17 ENCOUNTER — Other Ambulatory Visit (HOSPITAL_COMMUNITY): Payer: Self-pay

## 2021-05-20 ENCOUNTER — Other Ambulatory Visit (HOSPITAL_COMMUNITY): Payer: Self-pay

## 2021-05-20 MED FILL — Atorvastatin Calcium Tab 40 MG (Base Equivalent): ORAL | 90 days supply | Qty: 90 | Fill #1 | Status: AC

## 2021-10-16 ENCOUNTER — Other Ambulatory Visit (HOSPITAL_COMMUNITY): Payer: Self-pay

## 2021-12-04 DIAGNOSIS — E782 Mixed hyperlipidemia: Secondary | ICD-10-CM | POA: Diagnosis not present

## 2021-12-07 DIAGNOSIS — E782 Mixed hyperlipidemia: Secondary | ICD-10-CM | POA: Diagnosis not present

## 2021-12-09 ENCOUNTER — Other Ambulatory Visit (HOSPITAL_COMMUNITY): Payer: Self-pay

## 2022-04-05 ENCOUNTER — Other Ambulatory Visit (HOSPITAL_COMMUNITY): Payer: Self-pay

## 2022-04-05 MED ORDER — ATORVASTATIN CALCIUM 40 MG PO TABS
40.0000 mg | ORAL_TABLET | Freq: Every day | ORAL | 3 refills | Status: AC
  Filled 2022-04-05: qty 90, 90d supply, fill #0
  Filled 2022-07-19: qty 90, 90d supply, fill #1
  Filled 2022-10-17: qty 90, 90d supply, fill #2

## 2022-04-05 MED ORDER — VALACYCLOVIR HCL 1 G PO TABS
2000.0000 mg | ORAL_TABLET | Freq: Every day | ORAL | 11 refills | Status: AC
  Filled 2022-04-05: qty 30, 15d supply, fill #0
  Filled 2022-11-09: qty 30, 15d supply, fill #1

## 2022-04-06 ENCOUNTER — Other Ambulatory Visit (HOSPITAL_COMMUNITY): Payer: Self-pay

## 2022-04-20 ENCOUNTER — Other Ambulatory Visit (HOSPITAL_COMMUNITY): Payer: Self-pay

## 2022-05-31 DIAGNOSIS — E782 Mixed hyperlipidemia: Secondary | ICD-10-CM | POA: Diagnosis not present

## 2022-05-31 DIAGNOSIS — R7301 Impaired fasting glucose: Secondary | ICD-10-CM | POA: Diagnosis not present

## 2022-05-31 DIAGNOSIS — Z0000 Encounter for general adult medical examination without abnormal findings: Secondary | ICD-10-CM | POA: Diagnosis not present

## 2022-06-04 DIAGNOSIS — G8929 Other chronic pain: Secondary | ICD-10-CM | POA: Diagnosis not present

## 2022-06-04 DIAGNOSIS — Z23 Encounter for immunization: Secondary | ICD-10-CM | POA: Diagnosis not present

## 2022-06-04 DIAGNOSIS — Z79899 Other long term (current) drug therapy: Secondary | ICD-10-CM | POA: Diagnosis not present

## 2022-06-04 DIAGNOSIS — Z0000 Encounter for general adult medical examination without abnormal findings: Secondary | ICD-10-CM | POA: Diagnosis not present

## 2022-06-04 DIAGNOSIS — E782 Mixed hyperlipidemia: Secondary | ICD-10-CM | POA: Diagnosis not present

## 2022-06-04 DIAGNOSIS — Z8042 Family history of malignant neoplasm of prostate: Secondary | ICD-10-CM | POA: Diagnosis not present

## 2022-06-04 DIAGNOSIS — M25562 Pain in left knee: Secondary | ICD-10-CM | POA: Diagnosis not present

## 2022-07-19 ENCOUNTER — Other Ambulatory Visit (HOSPITAL_COMMUNITY): Payer: Self-pay

## 2022-09-06 ENCOUNTER — Other Ambulatory Visit (HOSPITAL_COMMUNITY): Payer: Self-pay

## 2022-09-06 DIAGNOSIS — H6192 Disorder of left external ear, unspecified: Secondary | ICD-10-CM | POA: Diagnosis not present

## 2022-09-06 DIAGNOSIS — E782 Mixed hyperlipidemia: Secondary | ICD-10-CM | POA: Diagnosis not present

## 2022-09-06 DIAGNOSIS — B353 Tinea pedis: Secondary | ICD-10-CM | POA: Diagnosis not present

## 2022-09-06 MED ORDER — NYSTATIN 100000 UNIT/GM EX POWD
Freq: Three times a day (TID) | CUTANEOUS | 3 refills | Status: AC | PRN
  Filled 2022-09-06: qty 60, 30d supply, fill #0

## 2022-09-24 ENCOUNTER — Other Ambulatory Visit (HOSPITAL_COMMUNITY): Payer: Self-pay

## 2022-10-18 ENCOUNTER — Other Ambulatory Visit: Payer: Self-pay

## 2022-11-09 ENCOUNTER — Other Ambulatory Visit: Payer: Self-pay

## 2022-12-29 ENCOUNTER — Other Ambulatory Visit (HOSPITAL_COMMUNITY): Payer: Self-pay
